# Patient Record
Sex: Female | Born: 1975 | Race: White | Hispanic: No | Marital: Married | Smoking: Former smoker
Health system: Southern US, Community
[De-identification: ages and names within clinical notes are randomized; demographics above are authoritative.]

## PROBLEM LIST (undated history)

## (undated) DIAGNOSIS — J45909 Unspecified asthma, uncomplicated: Secondary | ICD-10-CM

## (undated) DIAGNOSIS — Z87442 Personal history of urinary calculi: Secondary | ICD-10-CM

## (undated) DIAGNOSIS — N183 Chronic kidney disease, stage 3 unspecified: Secondary | ICD-10-CM

## (undated) DIAGNOSIS — Z9221 Personal history of antineoplastic chemotherapy: Secondary | ICD-10-CM

## (undated) DIAGNOSIS — K219 Gastro-esophageal reflux disease without esophagitis: Secondary | ICD-10-CM

## (undated) DIAGNOSIS — Z853 Personal history of malignant neoplasm of breast: Secondary | ICD-10-CM

## (undated) DIAGNOSIS — D649 Anemia, unspecified: Secondary | ICD-10-CM

## (undated) DIAGNOSIS — Z8669 Personal history of other diseases of the nervous system and sense organs: Secondary | ICD-10-CM

## (undated) DIAGNOSIS — M719 Bursopathy, unspecified: Secondary | ICD-10-CM

## (undated) DIAGNOSIS — E0500 Thyrotoxicosis with diffuse goiter without thyrotoxic crisis or storm: Secondary | ICD-10-CM

## (undated) DIAGNOSIS — J302 Other seasonal allergic rhinitis: Secondary | ICD-10-CM

## (undated) HISTORY — PX: ABDOMINAL HYSTERECTOMY: SHX81

## (undated) HISTORY — PX: CYSTOSCOPY W/ URETERAL STENT PLACEMENT: SHX1429

## (undated) HISTORY — PX: WISDOM TOOTH EXTRACTION: SHX21

---

## 2013-10-28 LAB — OB RESULTS CONSOLE HEPATITIS B SURFACE ANTIGEN: HEP B S AG: NEGATIVE

## 2013-10-28 LAB — OB RESULTS CONSOLE HIV ANTIBODY (ROUTINE TESTING): HIV: NONREACTIVE

## 2013-10-28 LAB — OB RESULTS CONSOLE ABO/RH: RH Type: NEGATIVE

## 2013-10-28 LAB — OB RESULTS CONSOLE RUBELLA ANTIBODY, IGM: RUBELLA: IMMUNE

## 2013-10-28 LAB — OB RESULTS CONSOLE ANTIBODY SCREEN: Antibody Screen: NEGATIVE

## 2013-10-28 LAB — OB RESULTS CONSOLE RPR: RPR: NONREACTIVE

## 2013-10-28 LAB — OB RESULTS CONSOLE GC/CHLAMYDIA
Chlamydia: NEGATIVE
GC PROBE AMP, GENITAL: NEGATIVE

## 2013-12-27 ENCOUNTER — Inpatient Hospital Stay (HOSPITAL_COMMUNITY): Payer: BC Managed Care – PPO

## 2013-12-27 ENCOUNTER — Encounter (HOSPITAL_COMMUNITY): Payer: Self-pay | Admitting: Family

## 2013-12-27 ENCOUNTER — Observation Stay (HOSPITAL_COMMUNITY)
Admission: AD | Admit: 2013-12-27 | Discharge: 2013-12-28 | Disposition: A | Discharge status: Home / Self Care | Payer: BC Managed Care – PPO | Source: Ambulatory Visit | Attending: Obstetrics and Gynecology | Admitting: Obstetrics and Gynecology

## 2013-12-27 DIAGNOSIS — E282 Polycystic ovarian syndrome: Secondary | ICD-10-CM | POA: Insufficient documentation

## 2013-12-27 DIAGNOSIS — O4410 Placenta previa with hemorrhage, unspecified trimester: Secondary | ICD-10-CM | POA: Diagnosis not present

## 2013-12-27 DIAGNOSIS — O4453 Low lying placenta with hemorrhage, third trimester: Secondary | ICD-10-CM | POA: Diagnosis present

## 2013-12-27 DIAGNOSIS — O3400 Maternal care for unspecified congenital malformation of uterus, unspecified trimester: Secondary | ICD-10-CM | POA: Diagnosis not present

## 2013-12-27 DIAGNOSIS — Q513 Bicornate uterus: Secondary | ICD-10-CM | POA: Diagnosis not present

## 2013-12-27 DIAGNOSIS — O09529 Supervision of elderly multigravida, unspecified trimester: Secondary | ICD-10-CM | POA: Insufficient documentation

## 2013-12-27 DIAGNOSIS — E039 Hypothyroidism, unspecified: Secondary | ICD-10-CM | POA: Diagnosis not present

## 2013-12-27 DIAGNOSIS — O99280 Endocrine, nutritional and metabolic diseases complicating pregnancy, unspecified trimester: Secondary | ICD-10-CM

## 2013-12-27 DIAGNOSIS — O4690 Antepartum hemorrhage, unspecified, unspecified trimester: Secondary | ICD-10-CM | POA: Diagnosis present

## 2013-12-27 DIAGNOSIS — E079 Disorder of thyroid, unspecified: Secondary | ICD-10-CM | POA: Diagnosis not present

## 2013-12-27 DIAGNOSIS — O368190 Decreased fetal movements, unspecified trimester, not applicable or unspecified: Secondary | ICD-10-CM | POA: Diagnosis not present

## 2013-12-27 HISTORY — DX: Bursopathy, unspecified: M71.9

## 2013-12-27 HISTORY — DX: Other seasonal allergic rhinitis: J30.2

## 2013-12-27 LAB — CBC WITH DIFFERENTIAL/PLATELET
Basophils Absolute: 0 10*3/uL (ref 0.0–0.1)
Basophils Relative: 0 % (ref 0–1)
EOS ABS: 0.1 10*3/uL (ref 0.0–0.7)
EOS PCT: 1 % (ref 0–5)
HEMATOCRIT: 28.4 % — AB (ref 36.0–46.0)
Hemoglobin: 9.4 g/dL — ABNORMAL LOW (ref 12.0–15.0)
LYMPHS ABS: 2.4 10*3/uL (ref 0.7–4.0)
Lymphocytes Relative: 20 % (ref 12–46)
MCH: 30.4 pg (ref 26.0–34.0)
MCHC: 33.1 g/dL (ref 30.0–36.0)
MCV: 91.9 fL (ref 78.0–100.0)
MONO ABS: 0.7 10*3/uL (ref 0.1–1.0)
Monocytes Relative: 6 % (ref 3–12)
Neutro Abs: 8.5 10*3/uL — ABNORMAL HIGH (ref 1.7–7.7)
Neutrophils Relative %: 73 % (ref 43–77)
PLATELETS: 215 10*3/uL (ref 150–400)
RBC: 3.09 MIL/uL — AB (ref 3.87–5.11)
RDW: 13.6 % (ref 11.5–15.5)
WBC: 11.7 10*3/uL — AB (ref 4.0–10.5)

## 2013-12-27 LAB — ABO/RH: ABO/RH(D): O NEG

## 2013-12-27 MED ORDER — ACETAMINOPHEN 325 MG PO TABS
650.0000 mg | ORAL_TABLET | ORAL | Status: DC | PRN
  Administered 2013-12-27: 650 mg via ORAL
  Filled 2013-12-27: qty 2

## 2013-12-27 MED ORDER — CALCIUM CARBONATE ANTACID 500 MG PO CHEW
2.0000 | CHEWABLE_TABLET | ORAL | Status: DC | PRN
  Filled 2013-12-27: qty 2

## 2013-12-27 MED ORDER — PRENATAL MULTIVITAMIN CH
1.0000 | ORAL_TABLET | Freq: Every day | ORAL | Status: DC
  Administered 2013-12-27 – 2013-12-28 (×2): 1 via ORAL
  Filled 2013-12-27 (×2): qty 1

## 2013-12-27 MED ORDER — FERROUS SULFATE 325 (65 FE) MG PO TABS
325.0000 mg | ORAL_TABLET | Freq: Every day | ORAL | Status: DC
  Administered 2013-12-28: 325 mg via ORAL
  Filled 2013-12-27: qty 1

## 2013-12-27 MED ORDER — FAMOTIDINE 20 MG PO TABS
20.0000 mg | ORAL_TABLET | Freq: Two times a day (BID) | ORAL | Status: DC
  Administered 2013-12-27 – 2013-12-28 (×3): 20 mg via ORAL
  Filled 2013-12-27 (×3): qty 1

## 2013-12-27 MED ORDER — ZOLPIDEM TARTRATE 5 MG PO TABS: 5.0000 mg | ORAL_TABLET | Freq: Every evening | ORAL | Status: DC | PRN

## 2013-12-27 MED ORDER — DOCUSATE SODIUM 100 MG PO CAPS
100.0000 mg | ORAL_CAPSULE | Freq: Every day | ORAL | Status: DC
  Administered 2013-12-27 – 2013-12-28 (×2): 100 mg via ORAL
  Filled 2013-12-27 (×3): qty 1

## 2013-12-27 MED ORDER — LEVOTHYROXINE SODIUM 25 MCG PO TABS
25.0000 ug | ORAL_TABLET | Freq: Every day | ORAL | Status: DC
  Administered 2013-12-27: 25 ug via ORAL
  Filled 2013-12-27 (×2): qty 1

## 2013-12-27 MED ORDER — LORATADINE 10 MG PO TABS
10.0000 mg | ORAL_TABLET | Freq: Every day | ORAL | Status: DC
  Administered 2013-12-27 – 2013-12-28 (×2): 10 mg via ORAL
  Filled 2013-12-27 (×3): qty 1

## 2013-12-27 NOTE — Progress Notes (Signed)
Pad # 2. 0.2cm x 5cm dark blood streak on pad noted.

## 2013-12-27 NOTE — H&P (Addendum)
Betty Ball is a 38 y.o. female [phone] at 24+5 with VB, h/o previa, now lowlying placenta - again eval by [place] 1.7cm from internal os.  +FM, no LOF, no VB, occ ctx; Pregnancy complicated by AMA, hypothyroidism, h/o GDM, kidney malformation; Also bicornuate uterus and PCOS.  Dated by 6wk [place] cw LMP - [facility] 04/13/14 Maternal Medical History:  Reason for admission: Vaginal bleeding.   Contractions: Frequency: irregular.    Fetal activity: Perceived fetal activity is normal.    Prenatal Complications - Diabetes: none.    OB History   Grav Para Term Preterm Abortions TAB SAB Ect Mult Living   5 2  2 2  2        SAB x 2 35wk LTCS 4#13 female 26wk LTCS 6# female, GDM No abn pap No STDs  Bicornuate uterus, PCOS  Past Medical History  Diagnosis Date  . Gestational diabetes     previous pregnancy  rosacea, hypothyroidism, migraines, hiatal hernia, bursitis, depression, seasonal allergies, kidney problems  Past Surgical History  Procedure Laterality Date  . 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   Prenatal Transfer Tool  Maternal Diabetes: No h/o GDM Genetic Screening: Normal Maternal Ultrasounds/Referrals: Abnormal:  Findings:   Other:previa - LLP Fetal Ultrasounds or other Referrals:  None Maternal Substance Abuse:  No Significant Maternal Medications:  Meds include: Syntroid Other: 17 OHP (Tuesday) Significant Maternal Lab Results:  None Other Comments:  bicornuate uterus, h/o 35 and 36 wk delivery  Review of Systems  Constitutional: Negative.   HENT: Negative.   Eyes: Negative.   Respiratory: Negative.   Cardiovascular: Negative.   Gastrointestinal: Negative.   Genitourinary: Negative.   Musculoskeletal: Negative.   Skin: Negative.   Neurological:  Negative.   Psychiatric/Behavioral: Negative.       Blood pressure 121/60, pulse 94, temperature 98 F (36.7 C), temperature source Oral, resp. rate 20. Maternal Exam:  Uterine Assessment: Contraction strength is mild.  Contraction frequency is irregular.   Abdomen: Surgical scars: low transverse.   Fundal height is appropriate for gestation.    Introitus: Normal vulva. Normal vagina.    Physical Exam  Constitutional: She is oriented to person, place, and time. She appears well-developed and well-nourished.  HENT:  Head: Normocephalic and atraumatic.  Cardiovascular: Normal rate and regular rhythm.   Respiratory: Effort normal and breath sounds normal. No respiratory distress. She has no wheezes.  GI: Soft. Bowel sounds are normal. She exhibits no distension. There is no tenderness.  Musculoskeletal: Normal range of motion.  Neurological: She is alert and oriented to person, place, and time.  Skin: Skin is warm and dry.  Psychiatric: She has a normal mood and affect. Her behavior is normal.    Prenatal labs: ABO, Rh:  O neg Antibody:  neg Rubella:  immune RPR:   NR HBsAg:   neg HIV:   neg GBS:   unknown  Hgb 12.2/Plt 286K/ GC neg/ Chl neg/ CF neg/ TSH elevated, then low - adjusting syntroid/AFP neg, informa WNL female/HgbA1C 5.6  6wk cwd, EDC 01/05/14 Anatomy nl limited previa Repeat, nl anat, female LLP  [place] in hospital nl AFI, no abruption noted, posterior LLP female  Hgb 9.4   Assessment/Plan: 37yo [id number] at 24+5 with 3rd trimester VB -  no abruption on [place], LLP Cont EFM/toco for 24 hr T/C BMZ D/w pt likely abruption and decreasing work hours Continue pad count/close monitoring  Hgb 9.4 - add daily iron  Betty Ball 12/27/2013, 2:46 PM

## 2013-12-27 NOTE — MAU Note (Signed)
38 yo, G5P3 at [redacted]w[redacted]d, presents to MAU with c/o VB since 0700 today; reports decreased FM. Pregnancy complicated by previa. She is taking 17P weekly.

## 2013-12-27 NOTE — Progress Notes (Signed)
Pad 1- 4cm sized area dark blood noted on pad with streak of dark blood on top. Pt given instructions on pad count and how to gt up to bathroom.

## 2013-12-28 DIAGNOSIS — O4690 Antepartum hemorrhage, unspecified, unspecified trimester: Secondary | ICD-10-CM | POA: Diagnosis present

## 2013-12-28 DIAGNOSIS — O4410 Placenta previa with hemorrhage, unspecified trimester: Secondary | ICD-10-CM | POA: Diagnosis not present

## 2013-12-28 LAB — PREPARE RBC (CROSSMATCH)

## 2013-12-28 MED ORDER — FERROUS SULFATE 325 (65 FE) MG PO TABS: 325.0000 mg | ORAL_TABLET | Freq: Every day | ORAL | Status: DC

## 2013-12-28 NOTE — Progress Notes (Signed)
Pt. Discharged home with significant other at side. Discharge teaching completed with pt. Verbalizing an understanding to receive P17 shot on Tuesday in office, follow up with physician next week, return for bleeding and s/s of preterm labor, no sex, mild to moderate activity, and take medication as prescribed.

## 2013-12-28 NOTE — Discharge Summary (Signed)
Obstetric Discharge Summary Reason for Admission: VB Prenatal Procedures: NST and ultrasound  Hemoglobin  Date Value Ref Range Status  12/27/2013 9.4* 12.0 - 15.0 g/dL Final     HCT  Date Value Ref Range Status  12/27/2013 28.4* 36.0 - 46.0 % Final    Physical Exam:  General: alert and no distress Uterus FNT +FM, no LOF, brownish d/c, occ cramping  Discharge Diagnoses: VB 3rd trimester  Discharge Information: Date: 12/28/2013 Activity: pelvic rest Diet: routine Medications: PNV Condition: stable Instructions: Call [phone] with questions or problems Discharge to: home Follow-up Information   Follow up with MEISINGER,TODD D, MD. (Come to office for 17P injection Tuesday, get worked in next week for office visit)    Specialty:  Obstetrics and Gynecology   Contact information:   [address] [place] [address] [place] 12/28/2013, 10:42 AM

## 2013-12-28 NOTE — Progress Notes (Signed)
Patient ID: Betty Ball, female   DOB: [birth year], 39 y.o.   MRN: [id number]  No c/o's, some brownish d/c.  occ ctx.  No LOF, occ cramping  AFVSS gen NAD FHTs 150's, appropriate for gestation toco occ  37yo with 3rd trimester VB Likely partial abruption, as bleeding had slowed considerably and stayed small at hospital steroids not given, strict precautions given for return,  Will consider BMZ

## 2013-12-30 LAB — TYPE AND SCREEN
ABO/RH(D): O NEG
Antibody Screen: NEGATIVE
Unit division: 0
Unit division: 0

## 2014-01-21 ENCOUNTER — Encounter: Payer: BC Managed Care – PPO | Attending: Obstetrics and Gynecology

## 2014-01-21 VITALS — Ht 62.5 in | Wt 223.1 lb

## 2014-01-21 DIAGNOSIS — O99810 Abnormal glucose complicating pregnancy: Secondary | ICD-10-CM

## 2014-01-21 DIAGNOSIS — Z713 Dietary counseling and surveillance: Secondary | ICD-10-CM | POA: Insufficient documentation

## 2014-01-28 NOTE — Progress Notes (Signed)
  Patient was seen on 01/21/2014 for Gestational Diabetes self-management class at the [facility]. The following learning objectives were met by the patient during this course:   States the definition of Gestational Diabetes  States why dietary management is important in controlling blood glucose  Describes the effects each nutrient has on blood glucose levels  Demonstrates ability to create a balanced meal plan  Demonstrates carbohydrate counting   States when to check blood glucose levels  Demonstrates proper blood glucose monitoring techniques  States the effect of stress and exercise on blood glucose levels  States the importance of limiting caffeine and abstaining from alcohol and smoking  Blood glucose monitor given: Accu Chek Nano BG Monitoring Kit Lot # [id number] Exp: 11/05/2014 Blood glucose reading: 90 mg/dl  Patient instructed to monitor glucose levels: FBS: 60 - <90 1 hour: <140 2 hour: <120  *Patient received handouts:  Nutrition Diabetes and Pregnancy  Carbohydrate Counting List  Patient will be seen for follow-up as needed.

## 2014-03-09 LAB — OB RESULTS CONSOLE GBS: GBS: NEGATIVE

## 2014-03-21 ENCOUNTER — Encounter (HOSPITAL_COMMUNITY): Payer: Self-pay

## 2014-03-21 ENCOUNTER — Inpatient Hospital Stay (HOSPITAL_COMMUNITY)
Admission: AD | Admit: 2014-03-21 | Discharge: 2014-03-21 | Disposition: A | Discharge status: Home / Self Care | Payer: Medicaid Other | Source: Ambulatory Visit | Attending: Obstetrics and Gynecology | Admitting: Obstetrics and Gynecology

## 2014-03-21 DIAGNOSIS — O479 False labor, unspecified: Secondary | ICD-10-CM | POA: Insufficient documentation

## 2014-03-21 MED ORDER — PANTOPRAZOLE SODIUM 40 MG PO TBEC
40.0000 mg | DELAYED_RELEASE_TABLET | Freq: Once | ORAL | Status: AC
  Administered 2014-03-21: 40 mg via ORAL
  Filled 2014-03-21: qty 1

## 2014-03-21 MED ORDER — PANTOPRAZOLE SODIUM 40 MG PO TBEC
40.0000 mg | DELAYED_RELEASE_TABLET | Freq: Every day | ORAL | Status: DC
  Filled 2014-03-21: qty 1

## 2014-03-21 NOTE — Discharge Instructions (Signed)
Braxton Hicks Contractions Contractions of the uterus can occur throughout pregnancy. Contractions are not always a sign that you are in labor.  WHAT ARE BRAXTON HICKS CONTRACTIONS?  Contractions that occur before labor are called Braxton Hicks contractions, or false labor. Toward the end of pregnancy (32-34 weeks), these contractions can develop more often and may become more forceful. This is not true labor because these contractions do not result in opening (dilatation) and thinning of the cervix. They are sometimes difficult to tell apart from true labor because these contractions can be forceful and people have different pain tolerances. You should not feel embarrassed if you go to the hospital with false labor. Sometimes, the only way to tell if you are in true labor is for your health care provider to look for changes in the cervix. If there are no prenatal problems or other health problems associated with the pregnancy, it is completely safe to be sent home with false labor and await the onset of true labor. HOW CAN YOU TELL THE DIFFERENCE BETWEEN TRUE AND FALSE LABOR? False Labor  The contractions of false labor are usually shorter and not as hard as those of true labor.   The contractions are usually irregular.   The contractions are often felt in the front of the lower abdomen and in the groin.   The contractions may go away when you walk around or change positions while lying down.   The contractions get weaker and are shorter lasting as time goes on.   The contractions do not usually become progressively stronger, regular, and closer together as with true labor.  True Labor  Contractions in true labor last 30-70 seconds, become very regular, usually become more intense, and increase in frequency.   The contractions do not go away with walking.   The discomfort is usually felt in the top of the uterus and spreads to the lower abdomen and low back.   True labor can be  determined by your health care provider with an exam. This will show that the cervix is dilating and getting thinner.  WHAT TO REMEMBER  Keep up with your usual exercises and follow other instructions given by your health care provider.   Take medicines as directed by your health care provider.   Keep your regular prenatal appointments.   Eat and drink lightly if you think you are going into labor.   If Braxton Hicks contractions are making you uncomfortable:   Change your position from lying down or resting to walking, or from walking to resting.   Sit and rest in a tub of warm water.   Drink 2-3 glasses of water. Dehydration may cause these contractions.   Do slow and deep breathing several times an hour.  WHEN SHOULD I SEEK IMMEDIATE MEDICAL CARE? Seek immediate medical care if:  Your contractions become stronger, more regular, and closer together.   You have fluid leaking or gushing from your vagina.   You have a fever.   You pass blood-tinged mucus.   You have vaginal bleeding.   You have continuous abdominal pain.   You have low back pain that you never had before.   You feel your baby's head pushing down and causing pelvic pressure.   Your baby is not moving as much as it used to.  Document Released: 07/24/2005 Document Revised: 07/29/2013 Document Reviewed: 05/05/2013 ExitCare Patient Information 2015 [employer]. This information is not intended to replace advice given to you by your health care   provider. Make sure you discuss any questions you have with your health care provider.  Fetal Movement Counts Patient Name: __________________________________________________ Patient Due Date: ____________________ Performing a fetal movement count is highly recommended in high-risk pregnancies, but it is good for every pregnant woman to do. Your health care provider may ask you to start counting fetal movements at 28 weeks of the pregnancy. Fetal  movements often increase:  After eating a full meal.  After physical activity.  After eating or drinking something sweet or cold.  At rest. Pay attention to when you feel the baby is most active. This will help you notice a pattern of your baby's sleep and wake cycles and what factors contribute to an increase in fetal movement. It is important to perform a fetal movement count at the same time each day when your baby is normally most active.  HOW TO COUNT FETAL MOVEMENTS 1. Find a quiet and comfortable area to sit or lie down on your left side. Lying on your left side provides the best blood and oxygen circulation to your baby. 2. Write down the day and time on a sheet of paper or in a journal. 3. Start counting kicks, flutters, swishes, rolls, or jabs in a 2-hour period. You should feel at least 10 movements within 2 hours. 4. If you do not feel 10 movements in 2 hours, wait 2-3 hours and count again. Look for a change in the pattern or not enough counts in 2 hours. SEEK MEDICAL CARE IF:  You feel less than 10 counts in 2 hours, tried twice.  There is no movement in over an hour.  The pattern is changing or taking longer each day to reach 10 counts in 2 hours.  You feel the baby is not moving as he or she usually does. Date: ____________ Movements: ____________ Start time: ____________ Finish time: ____________  Date: ____________ Movements: ____________ Start time: ____________ Finish time: ____________ Date: ____________ Movements: ____________ Start time: ____________ Finish time: ____________ Date: ____________ Movements: ____________ Start time: ____________ Finish time: ____________ Date: ____________ Movements: ____________ Start time: ____________ Finish time: ____________ Date: ____________ Movements: ____________ Start time: ____________ Finish time: ____________ Date: ____________ Movements: ____________ Start time: ____________ Finish time: ____________ Date: ____________  Movements: ____________ Start time: ____________ Finish time: ____________  Date: ____________ Movements: ____________ Start time: ____________ Finish time: ____________ Date: ____________ Movements: ____________ Start time: ____________ Finish time: ____________ Date: ____________ Movements: ____________ Start time: ____________ Finish time: ____________ Date: ____________ Movements: ____________ Start time: ____________ Finish time: ____________ Date: ____________ Movements: ____________ Start time: ____________ Finish time: ____________ Date: ____________ Movements: ____________ Start time: ____________ Finish time: ____________ Date: ____________ Movements: ____________ Start time: ____________ Finish time: ____________  Date: ____________ Movements: ____________ Start time: ____________ Finish time: ____________ Date: ____________ Movements: ____________ Start time: ____________ Finish time: ____________ Date: ____________ Movements: ____________ Start time: ____________ Finish time: ____________ Date: ____________ Movements: ____________ Start time: ____________ Finish time: ____________ Date: ____________ Movements: ____________ Start time: ____________ Finish time: ____________ Date: ____________ Movements: ____________ Start time: ____________ Finish time: ____________ Date: ____________ Movements: ____________ Start time: ____________ Finish time: ____________  Date: ____________ Movements: ____________ Start time: ____________ Finish time: ____________ Date: ____________ Movements: ____________ Start time: ____________ Finish time: ____________ Date: ____________ Movements: ____________ Start time: ____________ Finish time: ____________ Date: ____________ Movements: ____________ Start time: ____________ Finish time: ____________ Date: ____________ Movements: ____________ Start time: ____________ Finish time: ____________ Date: ____________ Movements: ____________ Start time:  ____________ Finish time: ____________ Date: ____________ Movements:   ____________ Start time: ____________ Finish time: ____________  Date: ____________ Movements: ____________ Start time: ____________ Finish time: ____________ Date: ____________ Movements: ____________ Start time: ____________ Finish time: ____________ Date: ____________ Movements: ____________ Start time: ____________ Finish time: ____________ Date: ____________ Movements: ____________ Start time: ____________ Finish time: ____________ Date: ____________ Movements: ____________ Start time: ____________ Finish time: ____________ Date: ____________ Movements: ____________ Start time: ____________ Finish time: ____________ Date: ____________ Movements: ____________ Start time: ____________ Finish time: ____________  Date: ____________ Movements: ____________ Start time: ____________ Finish time: ____________ Date: ____________ Movements: ____________ Start time: ____________ Finish time: ____________ Date: ____________ Movements: ____________ Start time: ____________ Finish time: ____________ Date: ____________ Movements: ____________ Start time: ____________ Finish time: ____________ Date: ____________ Movements: ____________ Start time: ____________ Finish time: ____________ Date: ____________ Movements: ____________ Start time: ____________ Finish time: ____________ Date: ____________ Movements: ____________ Start time: ____________ Finish time: ____________  Date: ____________ Movements: ____________ Start time: ____________ Finish time: ____________ Date: ____________ Movements: ____________ Start time: ____________ Finish time: ____________ Date: ____________ Movements: ____________ Start time: ____________ Finish time: ____________ Date: ____________ Movements: ____________ Start time: ____________ Finish time: ____________ Date: ____________ Movements: ____________ Start time: ____________ Finish time: ____________ Date:  ____________ Movements: ____________ Start time: ____________ Finish time: ____________ Date: ____________ Movements: ____________ Start time: ____________ Finish time: ____________  Date: ____________ Movements: ____________ Start time: ____________ Finish time: ____________ Date: ____________ Movements: ____________ Start time: ____________ Finish time: ____________ Date: ____________ Movements: ____________ Start time: ____________ Finish time: ____________ Date: ____________ Movements: ____________ Start time: ____________ Finish time: ____________ Date: ____________ Movements: ____________ Start time: ____________ Finish time: ____________ Date: ____________ Movements: ____________ Start time: ____________ Finish time: ____________ Document Released: 08/23/2006 Document Revised: 12/08/2013 Document Reviewed: 05/20/2012 ExitCare Patient Information 2015 [employer]. This information is not intended to replace advice given to you by your health care provider. Make sure you discuss any questions you have with your health care provider.  

## 2014-03-21 NOTE — MAU Note (Signed)
Dr Marvel Plan notified of SVE, contraction pattern and small amount of vaginal bleeding after SVE. Watch pt and recheck in 1-2 hours.

## 2014-03-21 NOTE — MAU Note (Signed)
Contractions every 5 mins for last 3 hours. Denies leaking fld or bleeding. Hx GDM and low lying placenta

## 2014-03-21 NOTE — Progress Notes (Signed)
Written and verbal d/c instructions given and understanding voiced. 

## 2014-03-26 ENCOUNTER — Encounter (HOSPITAL_COMMUNITY): Payer: Self-pay | Admitting: *Deleted

## 2014-03-26 ENCOUNTER — Other Ambulatory Visit (HOSPITAL_COMMUNITY): Payer: Self-pay | Admitting: Obstetrics and Gynecology

## 2014-03-26 ENCOUNTER — Inpatient Hospital Stay (HOSPITAL_COMMUNITY)
Admission: AD | Admit: 2014-03-26 | Discharge: 2014-03-28 | DRG: 780 | Disposition: A | Discharge status: Home / Self Care | Payer: Medicaid Other | Source: Ambulatory Visit | Attending: Obstetrics and Gynecology | Admitting: Obstetrics and Gynecology

## 2014-03-26 ENCOUNTER — Ambulatory Visit (HOSPITAL_COMMUNITY)
Admission: RE | Admit: 2014-03-26 | Discharge: 2014-03-26 | Disposition: A | Discharge status: Home / Self Care | Payer: Medicaid Other | Source: Ambulatory Visit | Attending: Obstetrics and Gynecology | Admitting: Obstetrics and Gynecology

## 2014-03-26 DIAGNOSIS — O99280 Endocrine, nutritional and metabolic diseases complicating pregnancy, unspecified trimester: Secondary | ICD-10-CM

## 2014-03-26 DIAGNOSIS — Z8632 Personal history of gestational diabetes: Secondary | ICD-10-CM

## 2014-03-26 DIAGNOSIS — O09529 Supervision of elderly multigravida, unspecified trimester: Secondary | ICD-10-CM | POA: Diagnosis present

## 2014-03-26 DIAGNOSIS — Q513 Bicornate uterus: Secondary | ICD-10-CM

## 2014-03-26 DIAGNOSIS — E079 Disorder of thyroid, unspecified: Secondary | ICD-10-CM | POA: Diagnosis present

## 2014-03-26 DIAGNOSIS — E039 Hypothyroidism, unspecified: Secondary | ICD-10-CM | POA: Diagnosis present

## 2014-03-26 DIAGNOSIS — O4453 Low lying placenta with hemorrhage, third trimester: Secondary | ICD-10-CM

## 2014-03-26 DIAGNOSIS — O479 False labor, unspecified: Principal | ICD-10-CM | POA: Diagnosis present

## 2014-03-26 DIAGNOSIS — O4693 Antepartum hemorrhage, unspecified, third trimester: Secondary | ICD-10-CM

## 2014-03-26 DIAGNOSIS — O34219 Maternal care for unspecified type scar from previous cesarean delivery: Secondary | ICD-10-CM | POA: Diagnosis present

## 2014-03-26 DIAGNOSIS — O288 Other abnormal findings on antenatal screening of mother: Secondary | ICD-10-CM

## 2014-03-26 DIAGNOSIS — O3400 Maternal care for unspecified congenital malformation of uterus, unspecified trimester: Secondary | ICD-10-CM | POA: Diagnosis present

## 2014-03-26 MED ORDER — NALBUPHINE HCL 10 MG/ML IJ SOLN: 5.0000 mg | Freq: Once | INTRAMUSCULAR | Status: DC

## 2014-03-26 MED ORDER — LACTATED RINGERS IV SOLN
INTRAVENOUS | Status: DC
  Administered 2014-03-26: via INTRAVENOUS

## 2014-03-26 NOTE — MAU Provider Note (Signed)
In MAU for labor check. FHTs 130's R, category 1

## 2014-03-26 NOTE — MAU Note (Signed)
Dr. Melba Coon notified of patient arrival. Hx of low lying placenta and bleeding after cervical exam. After careful review of records by Dr. Melba Coon, orders to perform cervical exam recieved. Dr. Melba Coon also notified of elevated BPs.

## 2014-03-26 NOTE — MAU Note (Signed)
Patient refuses pain medication at this time. LR infusing

## 2014-03-26 NOTE — MAU Note (Signed)
Ctx this evening. Denies LOF or vaginal bleeding. Was 1 cm in office. Scheduled for repeat c/section. Positive fetal movement.

## 2014-03-27 ENCOUNTER — Encounter (HOSPITAL_COMMUNITY): Payer: Self-pay | Admitting: *Deleted

## 2014-03-27 DIAGNOSIS — O3400 Maternal care for unspecified congenital malformation of uterus, unspecified trimester: Secondary | ICD-10-CM | POA: Diagnosis present

## 2014-03-27 DIAGNOSIS — O09529 Supervision of elderly multigravida, unspecified trimester: Secondary | ICD-10-CM | POA: Diagnosis present

## 2014-03-27 DIAGNOSIS — E039 Hypothyroidism, unspecified: Secondary | ICD-10-CM | POA: Diagnosis present

## 2014-03-27 DIAGNOSIS — Z8632 Personal history of gestational diabetes: Secondary | ICD-10-CM | POA: Diagnosis not present

## 2014-03-27 DIAGNOSIS — E079 Disorder of thyroid, unspecified: Secondary | ICD-10-CM | POA: Diagnosis present

## 2014-03-27 DIAGNOSIS — O34219 Maternal care for unspecified type scar from previous cesarean delivery: Secondary | ICD-10-CM | POA: Diagnosis present

## 2014-03-27 DIAGNOSIS — Q513 Bicornate uterus: Secondary | ICD-10-CM | POA: Diagnosis not present

## 2014-03-27 DIAGNOSIS — O479 False labor, unspecified: Secondary | ICD-10-CM | POA: Diagnosis present

## 2014-03-27 LAB — CBC
HCT: 27.9 % — ABNORMAL LOW (ref 36.0–46.0)
Hemoglobin: 9.3 g/dL — ABNORMAL LOW (ref 12.0–15.0)
MCH: 29.9 pg (ref 26.0–34.0)
MCHC: 33.3 g/dL (ref 30.0–36.0)
MCV: 89.7 fL (ref 78.0–100.0)
Platelets: 184 10*3/uL (ref 150–400)
RBC: 3.11 MIL/uL — ABNORMAL LOW (ref 3.87–5.11)
RDW: 14 % (ref 11.5–15.5)
WBC: 11.1 10*3/uL — ABNORMAL HIGH (ref 4.0–10.5)

## 2014-03-27 LAB — GLUCOSE, CAPILLARY
GLUCOSE-CAPILLARY: 79 mg/dL (ref 70–99)
Glucose-Capillary: 100 mg/dL — ABNORMAL HIGH (ref 70–99)
Glucose-Capillary: 85 mg/dL (ref 70–99)
Glucose-Capillary: 112 mg/dL — ABNORMAL HIGH (ref 70–99)
Glucose-Capillary: 156 mg/dL — ABNORMAL HIGH (ref 70–99)

## 2014-03-27 LAB — TYPE AND SCREEN
ABO/RH(D): O NEG
Antibody Screen: NEGATIVE

## 2014-03-27 LAB — RPR

## 2014-03-27 MED ORDER — GLYBURIDE 2.5 MG PO TABS
2.5000 mg | ORAL_TABLET | Freq: Every day | ORAL | Status: DC
  Administered 2014-03-27: 2.5 mg via ORAL
  Filled 2014-03-27: qty 1

## 2014-03-27 MED ORDER — FAMOTIDINE 20 MG PO TABS
20.0000 mg | ORAL_TABLET | Freq: Two times a day (BID) | ORAL | Status: DC
  Administered 2014-03-27: 20 mg via ORAL
  Filled 2014-03-27: qty 1

## 2014-03-27 MED ORDER — GLYBURIDE 5 MG PO TABS
5.0000 mg | ORAL_TABLET | Freq: Every day | ORAL | Status: DC
  Administered 2014-03-28: 5 mg via ORAL
  Filled 2014-03-27: qty 1

## 2014-03-27 MED ORDER — LEVOTHYROXINE SODIUM 25 MCG PO TABS
25.0000 ug | ORAL_TABLET | Freq: Every day | ORAL | Status: DC
  Administered 2014-03-27: 25 ug via ORAL
  Filled 2014-03-27: qty 1

## 2014-03-27 MED ORDER — LORATADINE 10 MG PO TABS
10.0000 mg | ORAL_TABLET | Freq: Every day | ORAL | Status: DC
  Administered 2014-03-27: 10 mg via ORAL
  Filled 2014-03-27: qty 1

## 2014-03-27 NOTE — H&P (Signed)
Betty Ball is a 38 y.o. female [phone] at 37 wk presents with contractions no cervical change, however continued ctx.  Pt has never been this far along in pregnancy before and is uncomfortable.    Maternal Medical History:  Reason for admission: Contractions.   Contractions: Frequency: regular.   Perceived severity is strong.    Fetal activity: Perceived fetal activity is normal.      OB History   Grav Para Term Preterm Abortions TAB SAB Ect Mult Living   5 2  2 2  2         Past Medical History  Diagnosis Date  . Bicornate uterus   . 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 Hypothyroidism   . Rosacea   . Migraines   . Seasonal allergies   . Bursitis   . Depression   . Hiatal hernia   . TMJ (dislocation of temporomandibular joint)   . Endometriosis   . Polycystic ovarian disease   . Gestational diabetes    Past Surgical History  Procedure Laterality Date  . Cesarean section  2001, 2009  . Kidney surgery    . Laprascopic    . Wisdom tooth extraction     Family History: family history is not on file. Social History:  reports that she has never smoked. She has never used smokeless tobacco. She reports that she does not drink alcohol or use illicit drugs.   Prenatal Transfer Tool  Maternal Diabetes: No Genetic Screening: Normal Maternal Ultrasounds/Referrals: Abnormal:  Findings:   Other:low-lying placenta Fetal Ultrasounds or other Referrals:  None Maternal Substance Abuse:  No Significant Maternal Medications:  Meds include: Syntroid Significant Maternal Lab Results:  Lab values include: Group B Strep negative Other Comments:  H/O 2 36 wk LTCS, on 17-OHP  Review of Systems  Constitutional: Negative.   HENT: Negative.   Eyes: Negative.   Respiratory: Negative.   Cardiovascular: Negative.   Gastrointestinal: Negative.   Genitourinary: Negative.   Musculoskeletal: Positive for back pain.  Skin: Negative.   Neurological: Negative.    Psychiatric/Behavioral: Negative.     Dilation: 1 Effacement (%): Thick Station: -3 Exam by:: L. Munford RN Blood pressure 121/75, pulse 91, temperature 97.6 F (36.4 C), temperature source Oral, resp. rate 18, height 5\' 2"  (1.575 m), weight 101.606 kg (224 lb). Maternal Exam:  Uterine Assessment: Contraction strength is moderate.  Contraction frequency is regular.   Abdomen: Surgical scars: low transverse.   Fundal height is appropriate for gestation.   Fetal presentation: vertex  Introitus: Normal vulva. Normal vagina.  Cervix: Cervix evaluated by digital exam.     Physical Exam  Constitutional: She is oriented to person, place, and time. She appears well-developed and well-nourished.  HENT:  Head: Normocephalic and atraumatic.  Cardiovascular: Normal rate and regular rhythm.   Respiratory: Effort normal and breath sounds normal. No respiratory distress. She has no wheezes.  GI: Soft. Bowel sounds are normal. She exhibits no distension. There is no tenderness.  Musculoskeletal: Normal range of motion.  Neurological: She is alert and oriented to person, place, and time.  Skin: Skin is warm and dry.  Psychiatric: She has a normal mood and affect. Her behavior is normal.    Prenatal labs: ABO, Rh: --/--/O NEG (08/20 2345) Antibody: NEG (08/20 2345) Rubella: Immune (03/24 0000) RPR: Nonreactive (03/24 0000)  HBsAg: Negative (03/24 0000)  HIV: Non-reactive (03/24 0000)  GBS: Negative (08/03 0000)   Assessment/Plan: 38yo [id number] at 59  wk admitted for observation with contractions and no cervical change, h/o LTCS x 2   Bovard-Stuckert, Zendayah Hardgrave 03/27/2014, 6:28 AM

## 2014-03-27 NOTE — Progress Notes (Signed)
Ur chart review completed.  

## 2014-03-27 NOTE — Progress Notes (Signed)
Patient ID: Betty Ball, female   DOB: [birth year], 38 y.o.   MRN: [id number]  Admitted overnight with ctx, no cervical change.  1cm in MAU, 1 cm this am. Rates ctx as 5-7, waxing and waning ctx, but always uncomfortable  AFVSS gen NAD FHTs 130-140, good var, category 1 toco q 3-35min  D/w pt POC and monitoring for now. Some VB after SVE Pt declines pain meds Feklt ctx got worse after IVF

## 2014-03-27 NOTE — Progress Notes (Signed)
Patient ID: Betty Ball, female   DOB: 10-04-1975, 37 y.o.   MRN: [id number] This pt has a normal FHR tracing. There is no active bleeding. She states she had 2 cups of blood but I have seen only scant blood. She will be observed for now in antenatal.

## 2014-03-28 LAB — GLUCOSE, CAPILLARY: Glucose-Capillary: 75 mg/dL (ref 70–99)

## 2014-03-28 NOTE — Discharge Instructions (Signed)
No vaginal entrance. Call with heavy bleeding or severe pain or any other problems

## 2014-03-28 NOTE — Discharge Summary (Signed)
NAMEJAMESINA, GAUGH               ACCOUNT NO.:  [id number]  MEDICAL RECORD NO.:  [id number]  LOCATION:  9528                          FACILITY:  [facility]  PHYSICIAN:  Lucille Passy. Ulanda Edison, M.D. DATE OF BIRTH:  1976-04-15  DATE OF ADMISSION:  03/26/2014 DATE OF DISCHARGE:  03/28/2014                              DISCHARGE SUMMARY   This is a 38 year old white female, para 2-0-2-2, gravida 5, who was admitted for observation because of contractions, no cervical change, and subsequently had some bleeding after exam.  She did not feel comfortable going home, so she was kept in observation overnight and has had no more significant bleeding.  Fetal heart rate tracing for the last 12 hours has been completely normal with no decelerations and accelerations.  Patient is now ready for discharge.  Her hemoglobin on admission was 9.3, hematocrit 27.9, white count 11,100, platelet count of 184,000.  RPR nonreactive.  FINAL DIAGNOSES:  Intrauterine pregnancy at 37 weeks and 4 days with some uterine contractions and vaginal bleeding, now resolved.  OPERATIONS:  None.  FINAL CONDITION:  Improved.  INSTRUCTIONS:  Include our regular discharge instructions including no vaginal entrance, no heavy lifting, or strenuous activity.  Call with extremely heavy bleeding or severe contractions.  She is to resume her calcium, ferrous sulfate, levothyroxine, loratadine, prenatal vitamins, and ranitidine, and also continue her glyburide 5 mg in the morning and 2.5 mg at night.  She is to return to the office in 2 days for followup examination and another nonstress test.     Lucille Passy. Ulanda Edison, M.D.     TFH/MEDQ  D:  03/28/2014  T:  03/28/2014  Job:  [id number]

## 2014-03-28 NOTE — Progress Notes (Signed)
Patient ID: Betty Ball, female   DOB: June 07, 1976, 38 y.o.   MRN: [id number] Pt is ready for D/C the FHR strip is completely normal. She has had scant bloody d/c

## 2014-04-03 ENCOUNTER — Encounter (HOSPITAL_COMMUNITY)
Admission: RE | Admit: 2014-04-03 | Discharge: 2014-04-03 | Disposition: A | Discharge status: Home / Self Care | Payer: Medicaid Other | Source: Ambulatory Visit | Attending: Obstetrics and Gynecology | Admitting: Obstetrics and Gynecology

## 2014-04-03 ENCOUNTER — Encounter (HOSPITAL_COMMUNITY): Payer: Self-pay | Admitting: Pharmacy Technician

## 2014-04-03 ENCOUNTER — Encounter (HOSPITAL_COMMUNITY): Payer: Self-pay

## 2014-04-03 LAB — BASIC METABOLIC PANEL
Anion gap: 12 (ref 5–15)
BUN: 18 mg/dL (ref 6–23)
CO2: 21 meq/L (ref 19–32)
Calcium: 8.8 mg/dL (ref 8.4–10.5)
Chloride: 101 mEq/L (ref 96–112)
Creatinine, Ser: 1.93 mg/dL — ABNORMAL HIGH (ref 0.50–1.10)
GFR calc Af Amer: 37 mL/min — ABNORMAL LOW (ref >90)
GFR calc non Af Amer: 32 mL/min — ABNORMAL LOW (ref >90)
Glucose, Bld: 98 mg/dL (ref 70–99)
Potassium: 3.7 mEq/L (ref 3.7–5.3)
SODIUM: 134 meq/L — AB (ref 137–147)

## 2014-04-03 LAB — TYPE AND SCREEN
ABO/RH(D): O NEG
Antibody Screen: NEGATIVE

## 2014-04-03 LAB — RPR

## 2014-04-03 LAB — CBC
HEMATOCRIT: 29.1 % — AB (ref 36.0–46.0)
Hemoglobin: 9.7 g/dL — ABNORMAL LOW (ref 12.0–15.0)
MCH: 30.2 pg (ref 26.0–34.0)
MCHC: 33.3 g/dL (ref 30.0–36.0)
MCV: 90.7 fL (ref 78.0–100.0)
Platelets: 204 10*3/uL (ref 150–400)
RBC: 3.21 MIL/uL — ABNORMAL LOW (ref 3.87–5.11)
RDW: 14.2 % (ref 11.5–15.5)
WBC: 11.1 10*3/uL — ABNORMAL HIGH (ref 4.0–10.5)

## 2014-04-03 NOTE — Patient Instructions (Signed)
   Your procedure is scheduled on: AUG 31 AT 730AM  Enter through the Main Entrance of [facility] at:6AM Pick up the phone at the desk and dial [phone] and inform us of your arrival.  Please call this number if you have any problems the morning of surgery: [phone]  Remember: Do not eat food after midnight: Do not drink clear liquids after:MIDNIGHT Take these medicines the morning of surgery with a SIP OF WATER:  Do not wear jewelry, make-up, or FINGER nail polish No metal in your hair or on your body. Do not wear lotions, powders, perfumes.  You may wear deodorant.  Do not bring valuables to the hospital. Contacts, dentures or bridgework may not be worn into surgery.  Leave suitcase in the car. After Surgery it may be brought to your room. For patients being admitted to the hospital, checkout time is 11:00am the day of discharge.    Patients discharged on the day of surgery will not be allowed to drive home.

## 2014-04-05 NOTE — H&P (Signed)
Betty Ball is a 38 y.o. female, G5 P25, EGA [redacted] weeks with [facility] 9-7 presenting for repeat c-section and BTL.  2 previous c-sections, declines VBAC.  Received weekly 17-P for h/o 2 pre-term deliveries due to pre-term labor.  GDM controlled with Glyburide, reactive NSTs.  Has low-lying placenta, some bleeding recently with ctx.  See prenatal records for complete history.  Maternal Medical History:  Fetal activity: Perceived fetal activity is normal.    Prenatal Complications - Diabetes: gestational. Diabetes is managed by oral agent (monotherapy).      OB History   Grav Para Term Preterm Abortions TAB SAB Ect Mult Living   5 2  2 2  2         Past Medical History  Diagnosis Date  . Bicornate uterus   . 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 Hypothyroidism   . Rosacea   . Migraines   . Seasonal allergies   . Bursitis   . Depression   . Hiatal hernia   . TMJ (dislocation of temporomandibular joint)   . Endometriosis   . Polycystic ovarian disease   . Gestational diabetes    Past Surgical History  Procedure Laterality Date  . Cesarean section  2001, 2009  . Kidney surgery    . Laprascopic    . Wisdom tooth extraction     Family History: family history is not on file. Social History:  reports that she has never smoked. She has never used smokeless tobacco. She reports that she does not drink alcohol or use illicit drugs.   Prenatal Transfer Tool  Maternal Diabetes: Yes:  Diabetes Type:  Insulin/Medication controlled Genetic Screening: Normal Maternal Ultrasounds/Referrals: Normal Fetal Ultrasounds or other Referrals:  None Maternal Substance Abuse:  No Significant Maternal Medications:  Meds include: Other:  Significant Maternal Lab Results:  Lab values include: Group B Strep negative Other Comments:  On Glyburide for GDM  Review of Systems  Respiratory: Negative.   Cardiovascular: Negative.       Last menstrual period 07/04/2013. Maternal Exam:   Abdomen: Surgical scars: low transverse.   Estimated fetal weight is 8 lbs.   Fetal presentation: vertex  Introitus: Normal vulva. Normal vagina.    Physical Exam  Constitutional: She appears well-developed and well-nourished.  Neck: Neck supple. No thyromegaly present.  Cardiovascular: Normal rate, regular rhythm and normal heart sounds.   No murmur heard. Respiratory: Effort normal and breath sounds normal. No respiratory distress. She has no wheezes.  GI: Soft.    Prenatal labs: ABO, Rh: --/--/O NEG (08/28 0805) Antibody: NEG (08/28 0805) Rubella: Immune (03/24 0000) RPR: NON REAC (08/28 0806)  HBsAg: Negative (03/24 0000)  HIV: Non-reactive (03/24 0000)  GBS: Negative (08/03 0000)   Assessment/Plan: IUP at 39 weeks with 2 previous c-sections, for repeat c-section and BTL. Well controlled GDM with Glyburide.  C-section procedure and risks discussed.  BTL options, risks, permanency, failure rate discussed.  Will admit for repeat c-section and bilateral salpingectomies.  Creatinine on pre-op labs is elevated at 1.9, will follow postpartum and see if nephrology consult needed.     Ashwini Jago D 04/05/2014, 6:47 PM

## 2014-04-06 ENCOUNTER — Inpatient Hospital Stay (HOSPITAL_COMMUNITY): Payer: Medicaid Other | Admitting: Anesthesiology

## 2014-04-06 ENCOUNTER — Inpatient Hospital Stay (HOSPITAL_COMMUNITY)
Admission: RE | Admit: 2014-04-06 | Payer: Medicaid Other | Source: Ambulatory Visit | Admitting: Obstetrics and Gynecology

## 2014-04-06 ENCOUNTER — Encounter (HOSPITAL_COMMUNITY): Payer: Medicaid Other | Admitting: Anesthesiology

## 2014-04-06 ENCOUNTER — Encounter (HOSPITAL_COMMUNITY): Payer: Self-pay | Admitting: *Deleted

## 2014-04-06 ENCOUNTER — Encounter (HOSPITAL_COMMUNITY)
Admission: AD | Disposition: A | Discharge status: Home / Self Care | Payer: Self-pay | Source: Ambulatory Visit | Attending: Obstetrics and Gynecology

## 2014-04-06 ENCOUNTER — Inpatient Hospital Stay (HOSPITAL_COMMUNITY)
Admission: AD | Admit: 2014-04-06 | Discharge: 2014-04-09 | DRG: 765 | Disposition: A | Discharge status: Home / Self Care | Payer: Medicaid Other | Source: Ambulatory Visit | Attending: Obstetrics and Gynecology | Admitting: Obstetrics and Gynecology

## 2014-04-06 DIAGNOSIS — F329 Major depressive disorder, single episode, unspecified: Secondary | ICD-10-CM | POA: Diagnosis present

## 2014-04-06 DIAGNOSIS — E039 Hypothyroidism, unspecified: Secondary | ICD-10-CM | POA: Diagnosis present

## 2014-04-06 DIAGNOSIS — K449 Diaphragmatic hernia without obstruction or gangrene: Secondary | ICD-10-CM | POA: Diagnosis present

## 2014-04-06 DIAGNOSIS — O34219 Maternal care for unspecified type scar from previous cesarean delivery: Principal | ICD-10-CM | POA: Diagnosis present

## 2014-04-06 DIAGNOSIS — Q513 Bicornate uterus: Secondary | ICD-10-CM

## 2014-04-06 DIAGNOSIS — O99214 Obesity complicating childbirth: Secondary | ICD-10-CM | POA: Diagnosis present

## 2014-04-06 DIAGNOSIS — E079 Disorder of thyroid, unspecified: Secondary | ICD-10-CM | POA: Diagnosis present

## 2014-04-06 DIAGNOSIS — O26839 Pregnancy related renal disease, unspecified trimester: Secondary | ICD-10-CM | POA: Diagnosis present

## 2014-04-06 DIAGNOSIS — Z98891 History of uterine scar from previous surgery: Secondary | ICD-10-CM

## 2014-04-06 DIAGNOSIS — Z302 Encounter for sterilization: Secondary | ICD-10-CM | POA: Diagnosis not present

## 2014-04-06 DIAGNOSIS — O09529 Supervision of elderly multigravida, unspecified trimester: Secondary | ICD-10-CM | POA: Diagnosis present

## 2014-04-06 DIAGNOSIS — O3400 Maternal care for unspecified congenital malformation of uterus, unspecified trimester: Secondary | ICD-10-CM | POA: Diagnosis present

## 2014-04-06 DIAGNOSIS — F3289 Other specified depressive episodes: Secondary | ICD-10-CM | POA: Diagnosis present

## 2014-04-06 DIAGNOSIS — O99814 Abnormal glucose complicating childbirth: Secondary | ICD-10-CM | POA: Diagnosis present

## 2014-04-06 DIAGNOSIS — N289 Disorder of kidney and ureter, unspecified: Secondary | ICD-10-CM | POA: Diagnosis present

## 2014-04-06 DIAGNOSIS — O99284 Endocrine, nutritional and metabolic diseases complicating childbirth: Secondary | ICD-10-CM

## 2014-04-06 DIAGNOSIS — Z6841 Body Mass Index (BMI) 40.0 and over, adult: Secondary | ICD-10-CM | POA: Diagnosis not present

## 2014-04-06 DIAGNOSIS — O99344 Other mental disorders complicating childbirth: Secondary | ICD-10-CM | POA: Diagnosis present

## 2014-04-06 DIAGNOSIS — E669 Obesity, unspecified: Secondary | ICD-10-CM | POA: Diagnosis present

## 2014-04-06 DIAGNOSIS — Z9851 Tubal ligation status: Secondary | ICD-10-CM

## 2014-04-06 HISTORY — PX: BILATERAL SALPINGECTOMY: SHX5743

## 2014-04-06 LAB — CBC
HEMATOCRIT: 29.1 % — AB (ref 36.0–46.0)
Hemoglobin: 9.9 g/dL — ABNORMAL LOW (ref 12.0–15.0)
MCH: 30.2 pg (ref 26.0–34.0)
MCHC: 34 g/dL (ref 30.0–36.0)
MCV: 88.7 fL (ref 78.0–100.0)
Platelets: 222 10*3/uL (ref 150–400)
RBC: 3.28 MIL/uL — ABNORMAL LOW (ref 3.87–5.11)
RDW: 13.7 % (ref 11.5–15.5)
WBC: 15 10*3/uL — AB (ref 4.0–10.5)

## 2014-04-06 LAB — GLUCOSE, CAPILLARY
GLUCOSE-CAPILLARY: 132 mg/dL — AB (ref 70–99)
Glucose-Capillary: 83 mg/dL (ref 70–99)

## 2014-04-06 LAB — TYPE AND SCREEN
ABO/RH(D): O NEG
ANTIBODY SCREEN: NEGATIVE

## 2014-04-06 LAB — RPR

## 2014-04-06 SURGERY — Surgical Case
Anesthesia: Spinal | Site: Abdomen

## 2014-04-06 SURGERY — Surgical Case
Anesthesia: Regional

## 2014-04-06 MED ORDER — ONDANSETRON HCL 4 MG/2ML IJ SOLN
INTRAMUSCULAR | Status: AC
  Filled 2014-04-06: qty 2

## 2014-04-06 MED ORDER — DIPHENHYDRAMINE HCL 50 MG/ML IJ SOLN: 25.0000 mg | INTRAMUSCULAR | Status: DC | PRN

## 2014-04-06 MED ORDER — ONDANSETRON HCL 4 MG/2ML IJ SOLN: 4.0000 mg | INTRAMUSCULAR | Status: DC | PRN

## 2014-04-06 MED ORDER — SIMETHICONE 80 MG PO CHEW
80.0000 mg | CHEWABLE_TABLET | ORAL | Status: DC
  Administered 2014-04-07 – 2014-04-09 (×3): 80 mg via ORAL
  Filled 2014-04-06 (×3): qty 1

## 2014-04-06 MED ORDER — GENTAMICIN SULFATE 40 MG/ML IJ SOLN
INTRAVENOUS | Status: AC
  Administered 2014-04-06: 114.5 mL via INTRAVENOUS
  Filled 2014-04-06: qty 8.75

## 2014-04-06 MED ORDER — NALBUPHINE HCL 10 MG/ML IJ SOLN: 5.0000 mg | INTRAMUSCULAR | Status: DC | PRN

## 2014-04-06 MED ORDER — FAMOTIDINE 20 MG PO TABS
20.0000 mg | ORAL_TABLET | Freq: Every day | ORAL | Status: DC
  Administered 2014-04-06 – 2014-04-09 (×4): 20 mg via ORAL
  Filled 2014-04-06 (×4): qty 1

## 2014-04-06 MED ORDER — KETOROLAC TROMETHAMINE 30 MG/ML IJ SOLN: 30.0000 mg | Freq: Four times a day (QID) | INTRAMUSCULAR | Status: AC | PRN

## 2014-04-06 MED ORDER — OXYTOCIN 10 UNIT/ML IJ SOLN
INTRAMUSCULAR | Status: AC
  Filled 2014-04-06: qty 4

## 2014-04-06 MED ORDER — SODIUM CHLORIDE 0.9 % IJ SOLN: 3.0000 mL | INTRAMUSCULAR | Status: DC | PRN

## 2014-04-06 MED ORDER — PHENYLEPHRINE 8 MG IN D5W 100 ML (0.08MG/ML) PREMIX OPTIME
INJECTION | INTRAVENOUS | Status: AC
  Filled 2014-04-06: qty 100

## 2014-04-06 MED ORDER — MORPHINE SULFATE (PF) 0.5 MG/ML IJ SOLN
INTRAMUSCULAR | Status: DC | PRN
  Administered 2014-04-06: .15 mg via INTRAVENOUS

## 2014-04-06 MED ORDER — OXYCODONE-ACETAMINOPHEN 5-325 MG PO TABS
1.0000 | ORAL_TABLET | ORAL | Status: DC | PRN
  Administered 2014-04-07: 2 via ORAL
  Administered 2014-04-07 – 2014-04-09 (×3): 1 via ORAL
  Filled 2014-04-06 (×2): qty 1
  Filled 2014-04-06 (×2): qty 2
  Filled 2014-04-06: qty 1

## 2014-04-06 MED ORDER — DIPHENHYDRAMINE HCL 25 MG PO CAPS: 25.0000 mg | ORAL_CAPSULE | Freq: Four times a day (QID) | ORAL | Status: DC | PRN

## 2014-04-06 MED ORDER — SENNOSIDES-DOCUSATE SODIUM 8.6-50 MG PO TABS
2.0000 | ORAL_TABLET | ORAL | Status: DC
  Administered 2014-04-07 – 2014-04-09 (×3): 2 via ORAL
  Filled 2014-04-06 (×3): qty 2

## 2014-04-06 MED ORDER — MORPHINE SULFATE 0.5 MG/ML IJ SOLN
INTRAMUSCULAR | Status: AC
  Filled 2014-04-06: qty 10

## 2014-04-06 MED ORDER — KETOROLAC TROMETHAMINE 60 MG/2ML IM SOLN
60.0000 mg | Freq: Once | INTRAMUSCULAR | Status: AC | PRN
Start: 2014-04-06 — End: 2014-04-06
  Administered 2014-04-06: 60 mg via INTRAMUSCULAR

## 2014-04-06 MED ORDER — OXYTOCIN 40 UNITS IN LACTATED RINGERS INFUSION - SIMPLE MED: 62.5000 mL/h | INTRAVENOUS | Status: AC

## 2014-04-06 MED ORDER — PHENYLEPHRINE 40 MCG/ML (10ML) SYRINGE FOR IV PUSH (FOR BLOOD PRESSURE SUPPORT)
PREFILLED_SYRINGE | INTRAVENOUS | Status: AC
  Filled 2014-04-06: qty 5

## 2014-04-06 MED ORDER — DIPHENHYDRAMINE HCL 50 MG/ML IJ SOLN
INTRAMUSCULAR | Status: DC | PRN
  Administered 2014-04-06: 25 mg via INTRAVENOUS

## 2014-04-06 MED ORDER — METOCLOPRAMIDE HCL 5 MG/ML IJ SOLN
10.0000 mg | Freq: Three times a day (TID) | INTRAMUSCULAR | Status: DC | PRN
  Administered 2014-04-06: 10 mg via INTRAVENOUS
  Filled 2014-04-06: qty 2

## 2014-04-06 MED ORDER — NALOXONE HCL 1 MG/ML IJ SOLN
1.0000 ug/kg/h | INTRAVENOUS | Status: DC | PRN
  Filled 2014-04-06: qty 2

## 2014-04-06 MED ORDER — WITCH HAZEL-GLYCERIN EX PADS
1.0000 | MEDICATED_PAD | CUTANEOUS | Status: DC | PRN
Start: 2014-04-06 — End: 2014-04-09

## 2014-04-06 MED ORDER — DIPHENHYDRAMINE HCL 25 MG PO CAPS: 25.0000 mg | ORAL_CAPSULE | ORAL | Status: DC | PRN

## 2014-04-06 MED ORDER — SCOPOLAMINE 1 MG/3DAYS TD PT72
1.0000 | MEDICATED_PATCH | Freq: Once | TRANSDERMAL | Status: AC
  Administered 2014-04-06: 1.5 mg via TRANSDERMAL

## 2014-04-06 MED ORDER — DIPHENHYDRAMINE HCL 50 MG/ML IJ SOLN: 12.5000 mg | INTRAMUSCULAR | Status: DC | PRN

## 2014-04-06 MED ORDER — LACTATED RINGERS IV SOLN
INTRAVENOUS | Status: DC
  Administered 2014-04-06 (×2): via INTRAVENOUS

## 2014-04-06 MED ORDER — MENTHOL 3 MG MT LOZG
1.0000 | LOZENGE | OROMUCOSAL | Status: DC | PRN
Start: 2014-04-06 — End: 2014-04-09

## 2014-04-06 MED ORDER — PHENYLEPHRINE HCL 10 MG/ML IJ SOLN
INTRAMUSCULAR | Status: DC | PRN
  Administered 2014-04-06: 80 ug via INTRAVENOUS

## 2014-04-06 MED ORDER — CALCIUM CARBONATE ANTACID 500 MG PO CHEW: 1.0000 | CHEWABLE_TABLET | Freq: Every day | ORAL | Status: DC | PRN

## 2014-04-06 MED ORDER — TETANUS-DIPHTH-ACELL PERTUSSIS 5-2.5-18.5 LF-MCG/0.5 IM SUSP: 0.5000 mL | Freq: Once | INTRAMUSCULAR | Status: DC

## 2014-04-06 MED ORDER — OXYTOCIN 10 UNIT/ML IJ SOLN
40.0000 [IU] | INTRAVENOUS | Status: DC | PRN
  Administered 2014-04-06: 40 [IU] via INTRAVENOUS

## 2014-04-06 MED ORDER — IBUPROFEN 800 MG PO TABS
800.0000 mg | ORAL_TABLET | Freq: Three times a day (TID) | ORAL | Status: DC
Start: 2014-04-06 — End: 2014-04-09
  Administered 2014-04-07 – 2014-04-09 (×7): 800 mg via ORAL
  Filled 2014-04-06 (×10): qty 1

## 2014-04-06 MED ORDER — PROMETHAZINE HCL 25 MG/ML IJ SOLN: 6.2500 mg | INTRAMUSCULAR | Status: DC | PRN

## 2014-04-06 MED ORDER — DIPHENHYDRAMINE HCL 50 MG/ML IJ SOLN
INTRAMUSCULAR | Status: AC
  Filled 2014-04-06: qty 1

## 2014-04-06 MED ORDER — LACTATED RINGERS IV BOLUS (SEPSIS)
500.0000 mL | Freq: Once | INTRAVENOUS | Status: AC
  Administered 2014-04-06: 500 mL via INTRAVENOUS

## 2014-04-06 MED ORDER — DIBUCAINE 1 % RE OINT
1.0000 | TOPICAL_OINTMENT | RECTAL | Status: DC | PRN
Start: 2014-04-06 — End: 2014-04-09

## 2014-04-06 MED ORDER — CITRIC ACID-SODIUM CITRATE 334-500 MG/5ML PO SOLN
30.0000 mL | Freq: Once | ORAL | Status: AC
  Administered 2014-04-06: 30 mL via ORAL
  Filled 2014-04-06: qty 15

## 2014-04-06 MED ORDER — KETOROLAC TROMETHAMINE 60 MG/2ML IM SOLN
INTRAMUSCULAR | Status: AC
  Filled 2014-04-06: qty 2

## 2014-04-06 MED ORDER — MEPERIDINE HCL 25 MG/ML IJ SOLN: 6.2500 mg | INTRAMUSCULAR | Status: DC | PRN

## 2014-04-06 MED ORDER — BUPIVACAINE IN DEXTROSE 0.75-8.25 % IT SOLN
INTRATHECAL | Status: DC | PRN
  Administered 2014-04-06: 1.6 mL via INTRATHECAL

## 2014-04-06 MED ORDER — FENTANYL CITRATE 0.05 MG/ML IJ SOLN
INTRAMUSCULAR | Status: AC
  Filled 2014-04-06: qty 2

## 2014-04-06 MED ORDER — FENTANYL CITRATE 0.05 MG/ML IJ SOLN
INTRAMUSCULAR | Status: DC | PRN
  Administered 2014-04-06: 15 ug via INTRATHECAL

## 2014-04-06 MED ORDER — LANOLIN HYDROUS EX OINT: 1.0000 "application " | TOPICAL_OINTMENT | CUTANEOUS | Status: DC | PRN

## 2014-04-06 MED ORDER — LACTATED RINGERS IV SOLN
INTRAVENOUS | Status: DC
  Administered 2014-04-06: 14:00:00 via INTRAVENOUS

## 2014-04-06 MED ORDER — PRENATAL MULTIVITAMIN CH
1.0000 | ORAL_TABLET | Freq: Every day | ORAL | Status: DC
  Administered 2014-04-08: 1 via ORAL
  Filled 2014-04-06 (×3): qty 1

## 2014-04-06 MED ORDER — ZOLPIDEM TARTRATE 5 MG PO TABS: 5.0000 mg | ORAL_TABLET | Freq: Every evening | ORAL | Status: DC | PRN

## 2014-04-06 MED ORDER — SIMETHICONE 80 MG PO CHEW
80.0000 mg | CHEWABLE_TABLET | Freq: Three times a day (TID) | ORAL | Status: DC
  Administered 2014-04-07 – 2014-04-09 (×6): 80 mg via ORAL
  Filled 2014-04-06 (×9): qty 1

## 2014-04-06 MED ORDER — SCOPOLAMINE 1 MG/3DAYS TD PT72
MEDICATED_PATCH | TRANSDERMAL | Status: AC
  Filled 2014-04-06: qty 1

## 2014-04-06 MED ORDER — MEPERIDINE HCL 25 MG/ML IJ SOLN
INTRAMUSCULAR | Status: AC
  Filled 2014-04-06: qty 1

## 2014-04-06 MED ORDER — ONDANSETRON HCL 4 MG/2ML IJ SOLN
INTRAMUSCULAR | Status: DC | PRN
  Administered 2014-04-06: 4 mg via INTRAVENOUS

## 2014-04-06 MED ORDER — FAMOTIDINE IN NACL 20-0.9 MG/50ML-% IV SOLN
20.0000 mg | Freq: Once | INTRAVENOUS | Status: AC
  Administered 2014-04-06: 20 mg via INTRAVENOUS
  Filled 2014-04-06: qty 50

## 2014-04-06 MED ORDER — HYDROMORPHONE HCL PF 1 MG/ML IJ SOLN: 0.2500 mg | INTRAMUSCULAR | Status: DC | PRN

## 2014-04-06 MED ORDER — MEPERIDINE HCL 25 MG/ML IJ SOLN
INTRAMUSCULAR | Status: DC | PRN
  Administered 2014-04-06 (×2): 12.5 mg via INTRAVENOUS

## 2014-04-06 MED ORDER — PHENYLEPHRINE 8 MG IN D5W 100 ML (0.08MG/ML) PREMIX OPTIME
INJECTION | INTRAVENOUS | Status: DC | PRN
  Administered 2014-04-06: 40 ug/min via INTRAVENOUS

## 2014-04-06 MED ORDER — LORATADINE 10 MG PO TABS
10.0000 mg | ORAL_TABLET | Freq: Every day | ORAL | Status: DC
  Administered 2014-04-06: 10 mg via ORAL
  Filled 2014-04-06 (×2): qty 1

## 2014-04-06 MED ORDER — ONDANSETRON HCL 4 MG/2ML IJ SOLN: 4.0000 mg | Freq: Three times a day (TID) | INTRAMUSCULAR | Status: DC | PRN

## 2014-04-06 MED ORDER — LEVOTHYROXINE SODIUM 25 MCG PO TABS
25.0000 ug | ORAL_TABLET | Freq: Every day | ORAL | Status: DC
  Administered 2014-04-06: 25 ug via ORAL
  Filled 2014-04-06 (×2): qty 1

## 2014-04-06 MED ORDER — NALOXONE HCL 0.4 MG/ML IJ SOLN: 0.4000 mg | INTRAMUSCULAR | Status: DC | PRN

## 2014-04-06 MED ORDER — SIMETHICONE 80 MG PO CHEW
80.0000 mg | CHEWABLE_TABLET | ORAL | Status: DC | PRN
  Administered 2014-04-09: 80 mg via ORAL

## 2014-04-06 MED ORDER — ONDANSETRON HCL 4 MG PO TABS
4.0000 mg | ORAL_TABLET | ORAL | Status: DC | PRN
  Administered 2014-04-07: 4 mg via ORAL
  Filled 2014-04-06: qty 1

## 2014-04-06 SURGICAL SUPPLY — 37 items
APL SKNCLS STERI-STRIP NONHPOA (GAUZE/BANDAGES/DRESSINGS) ×1
BENZOIN TINCTURE PRP APPL 2/3 (GAUZE/BANDAGES/DRESSINGS) ×1 IMPLANT
BLADE SURG 10 STRL SS (BLADE) ×4 IMPLANT
CHLORAPREP W/TINT 26ML (MISCELLANEOUS) ×1 IMPLANT
CLAMP CORD UMBIL (MISCELLANEOUS) IMPLANT
CLOTH BEACON ORANGE TIMEOUT ST (SAFETY) ×2 IMPLANT
CONTAINER PREFILL 10% NBF 15ML (MISCELLANEOUS) IMPLANT
DRAPE LG THREE QUARTER DISP (DRAPES) IMPLANT
DRSG OPSITE POSTOP 4X10 (GAUZE/BANDAGES/DRESSINGS) ×2 IMPLANT
DURAPREP 26ML APPLICATOR (WOUND CARE) ×1 IMPLANT
ELECT REM PT RETURN 9FT ADLT (ELECTROSURGICAL) ×2
ELECTRODE REM PT RTRN 9FT ADLT (ELECTROSURGICAL) ×1 IMPLANT
EXTRACTOR VACUUM M CUP 4 TUBE (SUCTIONS) IMPLANT
GLOVE BIO SURGEON STRL SZ 6.5 (GLOVE) ×2 IMPLANT
GOWN STRL REUS W/TWL LRG LVL3 (GOWN DISPOSABLE) ×4 IMPLANT
KIT ABG SYR 3ML LUER SLIP (SYRINGE) IMPLANT
NDL HYPO 25X5/8 SAFETYGLIDE (NEEDLE) IMPLANT
NEEDLE HYPO 25X5/8 SAFETYGLIDE (NEEDLE) IMPLANT
NS IRRIG 1000ML POUR BTL (IV SOLUTION) ×2 IMPLANT
PACK C SECTION WH (CUSTOM PROCEDURE TRAY) ×2 IMPLANT
PAD OB MATERNITY 4.3X12.25 (PERSONAL CARE ITEMS) ×2 IMPLANT
RTRCTR C-SECT PINK 25CM LRG (MISCELLANEOUS) ×2 IMPLANT
STAPLER VISISTAT 35W (STAPLE) IMPLANT
STRIP CLOSURE SKIN 1/2X4 (GAUZE/BANDAGES/DRESSINGS) ×1 IMPLANT
SUT MNCRL 0 VIOLET CTX 36 (SUTURE) ×2 IMPLANT
SUT MONOCRYL 0 CTX 36 (SUTURE) ×2
SUT PLAIN 1 NONE 54 (SUTURE) ×1 IMPLANT
SUT PLAIN 2 0 XLH (SUTURE) ×2 IMPLANT
SUT VIC AB 0 CT1 27 (SUTURE) ×4
SUT VIC AB 0 CT1 27XBRD ANBCTR (SUTURE) ×2 IMPLANT
SUT VIC AB 2-0 CT1 27 (SUTURE) ×2
SUT VIC AB 2-0 CT1 TAPERPNT 27 (SUTURE) ×1 IMPLANT
SUT VIC AB 4-0 KS 27 (SUTURE) ×1 IMPLANT
SYR BULB IRRIGATION 50ML (SYRINGE) ×2 IMPLANT
TOWEL OR 17X24 6PK STRL BLUE (TOWEL DISPOSABLE) ×2 IMPLANT
TRAY FOLEY CATH 14FR (SET/KITS/TRAYS/PACK) ×2 IMPLANT
WATER STERILE IRR 1000ML POUR (IV SOLUTION) ×2 IMPLANT

## 2014-04-06 NOTE — Op Note (Signed)
Betty Ball, Betty Ball               ACCOUNT NO.:  [id number]  MEDICAL RECORD NO.:  [id number]  LOCATION:  [facility]                          FACILITY:  [facility]  PHYSICIAN:  Thornell Sartorius, MD        DATE OF BIRTH:  1975-11-01  DATE OF PROCEDURE:  04/06/2014 DATE OF DISCHARGE:                              OPERATIVE REPORT   PREOPERATIVE DIAGNOSES:  Intrauterine pregnancy at term, history of low- transverse cesarean section x2, undesired fertility.  POSTOPERATIVE DIAGNOSES:  Intrauterine pregnancy at term, history of low- transverse cesarean section x2, undesired fertility, delivered. Bilateral tubal ligation by bilateral salpingectomy.  PROCEDURE:  Repeat low-transverse cesarean section, bilateral salpingectomy.  SURGEON:  Thornell Sartorius, MD  ANESTHESIA:  Spinal.  FINDINGS:  Viable female infant at 4:28 a.m. with Apgars pending and weight pending at the time of dictation.  Normal uterus, tubes, and ovaries are noted.  PATHOLOGY:  Placenta to L and D, bilateral tubal segments to pathology.  COMPLICATIONS:  None.  PROCEDURE:  After informed consent was reviewed with the patient and her spouse, she was transported to the OR, where spinal anesthesia was placed and found to be adequate.  She was then prepped and draped in the normal sterile fashion.  A Pfannenstiel incision was made at the level of her previous incision, carried through the underlying layer of fascia sharply.  The fascia was incised in the midline.  This incision was extended laterally with Mayo scissors.  The superior aspect of the fascial incision was grasped with clamps, elevated, and the rectus muscles were dissected off both bluntly and sharply.  Midline was easily identified.  The peritoneum was entered bluntly.  This incision was extended superiorly and inferiorly with good visualization of the bladder.  Some bladder to lower uterine segment adhesions were noted and lysed.  The Alexis skin retractor was placed carefully  making sure no bowel was entrapped.  Uterus was incised in a transverse fashion. Infant was delivered from a complete breech presentation without difficulty.  Nose and mouth were suctioned on the field.  Cord was clamped and cut.  Infant was handed off to the awaiting pediatric staff. The placenta was expressed.  The uterus was cleared of all clot and debris.  The uterine incision was closed with 2 layers of 0 Monocryl, the first of which was a running locked and the second as an imbricating layer.  This was noted to be hemostatic.  Bilateral salpingectomy was performed initially at the left.  The tube was easily identified followed out to the fimbriated end using a Claiborne Billings, was doubly ligated with 0 plain gut.  The tubal segment was sent to pathology.  This was performed also on the right.  Hemostasis was assured.  The peritoneum was reapproximated with 2-0 Vicryl in a running fashion.  The subfascial planes were inspected, found to be hemostatic.  The fascia was closed with 0 Vicryl as a running suture.  The subcuticular adipose layer was made hemostatic with Bovie cautery.  The dead space was closed with plain gut.  The skin was closed with 4-0 Vicryl.  Benzoin and Steri- Strips.  The patient tolerated the procedure well.  Sponge, lap, and  needle counts were correct x2 per the operating staff.     Thornell Sartorius, MD     JB/MEDQ  D:  04/06/2014  T:  04/06/2014  Job:  [id number]

## 2014-04-06 NOTE — Anesthesia Procedure Notes (Signed)
Spinal  Patient location during procedure: OR Staffing Anesthesiologist: Nolon Nations R Performed by: anesthesiologist  Preanesthetic Checklist Completed: patient identified, site marked, surgical consent, pre-op evaluation, timeout performed, IV checked, risks and benefits discussed and monitors and equipment checked Spinal Block Patient position: sitting Prep: Betadine Patient monitoring: heart rate, continuous pulse ox and blood pressure Location: L3-4 Injection technique: single-shot Needle Needle type: Sprotte  Needle gauge: 24 G Needle length: 9 cm Assessment Sensory level: T6 Additional Notes Expiration date of kit checked and confirmed. Patient tolerated procedure well, without complications.

## 2014-04-06 NOTE — Brief Op Note (Signed)
04/06/2014  5:18 AM  PATIENT:  Betty Ball  38 y.o. female  PRE-OPERATIVE DIAGNOSIS:  repeat cesarean section and bilateral salpingectomy  POST-OPERATIVE DIAGNOSIS:  repeat cesarean section and bilateral salpingectomy  PROCEDURE:  Procedure(s): CESAREAN SECTION (N/A) BILATERAL SALPINGECTOMY (N/A)  SURGEON:  Surgeon(s) and Role:    * Shamyia Grandpre Bovard-Stuckert, MD - Primary  ANESTHESIA:   spinal  FINDINGS: viable female infant at 4:28am, apgars P, Wt P; nl uterus, tubes and ovaries  EBL:  Total I/O In: 2500 [I.V.:2500] Out: 700 [Urine:100; Blood:600]  BLOOD ADMINISTERED:none  DRAINS: Urinary Catheter (Foley)   LOCAL MEDICATIONS USED:  NONE  SPECIMEN:  Source of Specimen:  Placenta, B tubal segments  DISPOSITION OF SPECIMEN:  L&D, pathology  COUNTS:  YES  TOURNIQUET:  * No tourniquets in log *  DICTATION: .Other Dictation: Dictation Number [id number]  PLAN OF CARE: Admit to inpatient   PATIENT DISPOSITION:  PACU - hemodynamically stable.   Delay start of Pharmacological VTE agent (>24hrs) due to surgical blood loss or risk of bleeding: not applicable

## 2014-04-06 NOTE — Anesthesia Postprocedure Evaluation (Signed)
  Anesthesia Post-op Note Anesthesia Post Note  Patient: Betty Ball  Procedure(s) Performed: Procedure(s) (LRB): CESAREAN SECTION (N/A) BILATERAL SALPINGECTOMY (N/A)  Anesthesia type: Spinal  Patient location: PACU  Post pain: Pain level controlled  Post assessment: Post-op Vital signs reviewed  Last Vitals:  Filed Vitals:   04/06/14 0715  BP: 108/66  Pulse: 83  Temp: 36.6 C  Resp: 17    Post vital signs: Reviewed  Level of consciousness: awake  Complications: No apparent anesthesia complications

## 2014-04-06 NOTE — Lactation Note (Signed)
This note was copied from the chart of Betty Claris Gower. Lactation Consultation Note  Mom is currently in the NICU visiting baby.  Baby was transferred to NICU this AM for low blood sugars.  Mom has been on glyburide during pregnancy.  MBU RN has initiated DEBP .  Lactation services and support information along with Providing Breastmilk for Your Baby In NICU booklet left at bedside.  Will follow up.  Patient Name: Betty Ball Today's Date: 04/06/2014     Maternal Data    Feeding Feeding Type: Formula Nipple Type: Regular Length of feed: 15 min  LATCH Score/Interventions                      Lactation Tools Discussed/Used     Consult Status      Ave Filter 04/06/2014, 3:13 PM

## 2014-04-06 NOTE — MAU Provider Note (Signed)
  Pt presents with ROM for meconium.  SVE changed from last office exam.  For rLTCS/BTL at 7:30am.  D/W pt r/b/a of rLTCS and BTL including, but not limited to bleeding, infection, damage to surrounding organs, injury to infant, trouble healing.  Pt desires BTL by B salpingectomy.    FHTs 150 R, category 1 toco Q 16min  FSBS = 83 Will follow CR, elevated on pre-op labs.

## 2014-04-06 NOTE — Anesthesia Preprocedure Evaluation (Signed)
Anesthesia Evaluation  Patient identified by MRN, date of birth, ID band Patient awake    Reviewed: Allergy & Precautions, H&P , NPO status , Patient's Chart, lab work & pertinent test results  History of Anesthesia Complications (+) PONV  Airway Mallampati: II TM Distance: >3 FB Neck ROM: Full    Dental no notable dental hx.    Pulmonary neg pulmonary ROS,  breath sounds clear to auscultation  Pulmonary exam normal       Cardiovascular negative cardio ROS  Rhythm:Regular Rate:Normal     Neuro/Psych  Headaches, PSYCHIATRIC DISORDERS Depression    GI/Hepatic Neg liver ROS, hiatal hernia,   Endo/Other  diabetes, GestationalHypothyroidism Morbid obesity  Renal/GU Renal disease     Musculoskeletal negative musculoskeletal ROS (+)   Abdominal (+) + obese,   Peds  Hematology negative hematology ROS (+)   Anesthesia Other Findings   Reproductive/Obstetrics (+) Pregnancy                           Anesthesia Physical Anesthesia Plan  ASA: III and emergent  Anesthesia Plan: Spinal   Post-op Pain Management:    Induction:   Airway Management Planned: Natural Airway  Additional Equipment:   Intra-op Plan:   Post-operative Plan:   Informed Consent: I have reviewed the patients History and Physical, chart, labs and discussed the procedure including the risks, benefits and alternatives for the proposed anesthesia with the patient or authorized representative who has indicated his/her understanding and acceptance.     Plan Discussed with: CRNA and Surgeon  Anesthesia Plan Comments: (Surgeon feels emergency due to pts, two prior c/s and meconium stained amniotic fluid)        Anesthesia Quick Evaluation

## 2014-04-06 NOTE — Lactation Note (Signed)
This note was copied from the chart of Betty Claris Gower. Lactation Consultation Note      Initial consult with this mom of a NICU baby, now 68 hours old, and [redacted] weeks gestation. Mom was holding baby skin to skin while I did teaching on pumping for a  baby in the NICU, how to set premie setting, hand expression done - mom expressed about 1 mls easily in about 1 minutes. I will follow up with mom and baby tomorrow. Mom reports her goal to provide EBM for about the first 6 weeks of life. Mom is active with [facility], and info will be faxed to them for a DEP.  Patient Name: Betty Ball Today's Date: 04/06/2014 Reason for consult: Initial assessment;NICU baby   Maternal Data Formula Feeding for Exclusion: Yes (baby in the NICU for hypoglycemia) Has patient been taught Hand Expression?: Yes Does the patient have breastfeeding experience prior to this delivery?: Yes  Feeding Feeding Type: Formula Nipple Type: Regular Length of feed: 20 min  LATCH Score/Interventions                      Lactation Tools Discussed/Used WIC Program: Yes ([place] - to fax info on mom ofr DEP) Pump Review: Setup, frequency, and cleaning;Milk Storage;Other (comment) (premie setting, hand expression, review of NICU booklet) Initiated by:: bedside RN Date initiated:: 04/06/14   Consult Status Consult Status: Follow-up Date: 04/07/14 Follow-up type: In-patient    Tonna Corner 04/06/2014, 4:34 PM

## 2014-04-06 NOTE — Consult Note (Signed)
Neonatology Note:   Attendance at C-section:    I was asked by Dr. Sandford Craze to attend this repeat C/S at term (scheduled for later today, but patient had SROM). The mother is a G5P2A2 O neg, GBS neg with GDM, on glyburide, bicornuate uterus, hypothyroidism, endometriosis, PCOD, and a history of depression. ROM 3 hours prior to delivery, fluid with thin meconium. Infant vigorous with good spontaneous cry and tone. Needed only minimal bulb suctioning. Ap 8/9. Lungs clear to ausc in DR. To CN to care of Pediatrician.   Real Cons, MD

## 2014-04-06 NOTE — Transfer of Care (Signed)
Immediate Anesthesia Transfer of Care Note  Patient: Betty Ball  Procedure(s) Performed: Procedure(s): CESAREAN SECTION (N/A) BILATERAL SALPINGECTOMY (N/A)  Patient Location: PACU  Anesthesia Type:Spinal  Level of Consciousness: awake, alert  and oriented  Airway & Oxygen Therapy: Patient Spontanous Breathing  Post-op Assessment: Report given to PACU RN and Post -op Vital signs reviewed and stable  Post vital signs: Reviewed and stable  Complications: No apparent anesthesia complications

## 2014-04-07 ENCOUNTER — Ambulatory Visit (HOSPITAL_COMMUNITY): Payer: Medicaid Other

## 2014-04-07 ENCOUNTER — Encounter (HOSPITAL_COMMUNITY): Payer: Self-pay | Admitting: Obstetrics and Gynecology

## 2014-04-07 LAB — URINALYSIS, ROUTINE W REFLEX MICROSCOPIC
BILIRUBIN URINE: NEGATIVE
GLUCOSE, UA: NEGATIVE mg/dL
KETONES UR: NEGATIVE mg/dL
Leukocytes, UA: NEGATIVE
Nitrite: NEGATIVE
PH: 6 (ref 5.0–8.0)
Protein, ur: NEGATIVE mg/dL
Specific Gravity, Urine: <1.005 — ABNORMAL LOW (ref 1.005–1.030)
Urobilinogen, UA: 0.2 mg/dL (ref 0.0–1.0)

## 2014-04-07 LAB — BASIC METABOLIC PANEL
ANION GAP: 11 (ref 5–15)
ANION GAP: 11 (ref 5–15)
BUN: 24 mg/dL — AB (ref 6–23)
BUN: 24 mg/dL — AB (ref 6–23)
CHLORIDE: 103 meq/L (ref 96–112)
CO2: 24 mEq/L (ref 19–32)
CO2: 24 meq/L (ref 19–32)
CREATININE: 2.37 mg/dL — AB (ref 0.50–1.10)
CREATININE: 2.48 mg/dL — AB (ref 0.50–1.10)
Calcium: 8.9 mg/dL (ref 8.4–10.5)
Calcium: 8.9 mg/dL (ref 8.4–10.5)
Chloride: 103 mEq/L (ref 96–112)
GFR calc Af Amer: 29 mL/min — ABNORMAL LOW (ref >90)
GFR calc Af Amer: 27 mL/min — ABNORMAL LOW (ref >90)
GFR calc non Af Amer: 25 mL/min — ABNORMAL LOW (ref >90)
GFR calc non Af Amer: 24 mL/min — ABNORMAL LOW (ref >90)
GLUCOSE: 138 mg/dL — AB (ref 70–99)
Glucose, Bld: 74 mg/dL (ref 70–99)
POTASSIUM: 4.2 meq/L (ref 3.7–5.3)
Potassium: 4.4 mEq/L (ref 3.7–5.3)
Sodium: 138 mEq/L (ref 137–147)
Sodium: 138 mEq/L (ref 137–147)

## 2014-04-07 LAB — CBC
HCT: 21.4 % — ABNORMAL LOW (ref 36.0–46.0)
Hemoglobin: 7.2 g/dL — ABNORMAL LOW (ref 12.0–15.0)
MCH: 30.3 pg (ref 26.0–34.0)
MCHC: 33.6 g/dL (ref 30.0–36.0)
MCV: 89.9 fL (ref 78.0–100.0)
Platelets: 142 10*3/uL — ABNORMAL LOW (ref 150–400)
RBC: 2.38 MIL/uL — ABNORMAL LOW (ref 3.87–5.11)
RDW: 14.1 % (ref 11.5–15.5)
WBC: 11.3 10*3/uL — ABNORMAL HIGH (ref 4.0–10.5)

## 2014-04-07 LAB — URINE MICROSCOPIC-ADD ON

## 2014-04-07 LAB — BIRTH TISSUE RECOVERY COLLECTION (PLACENTA DONATION)

## 2014-04-07 MED ORDER — RHO D IMMUNE GLOBULIN 1500 UNIT/2ML IJ SOSY
300.0000 ug | PREFILLED_SYRINGE | Freq: Once | INTRAMUSCULAR | Status: AC
  Administered 2014-04-07: 300 ug via INTRAMUSCULAR
  Filled 2014-04-07: qty 2

## 2014-04-07 MED ORDER — CHLORHEXIDINE GLUCONATE 4 % EX LIQD
Freq: Once | CUTANEOUS | Status: AC
  Administered 2014-04-07: 11:00:00 via TOPICAL
  Filled 2014-04-07: qty 118

## 2014-04-07 MED ORDER — LEVOTHYROXINE SODIUM 25 MCG PO TABS
25.0000 ug | ORAL_TABLET | Freq: Every day | ORAL | Status: DC
  Administered 2014-04-07 – 2014-04-08 (×2): 25 ug via ORAL
  Filled 2014-04-07 (×2): qty 1

## 2014-04-07 MED ORDER — LORATADINE 10 MG PO TABS
10.0000 mg | ORAL_TABLET | Freq: Every day | ORAL | Status: DC
  Administered 2014-04-07 – 2014-04-08 (×2): 10 mg via ORAL
  Filled 2014-04-07 (×2): qty 1

## 2014-04-07 NOTE — Progress Notes (Signed)
Rhogam given lot # [id number] exp date 05/26/16 given in rt gluteal.

## 2014-04-07 NOTE — Progress Notes (Signed)
Notified Dr. Melba Coon of BMP, U/A, renal ultrasound results. No new orders at this time.

## 2014-04-07 NOTE — Progress Notes (Addendum)
Subjective: Postpartum Day 1: Cesarean Delivery Patient reports incisional pain and tolerating PO.  Nl lochia, pain controlled.  Cr 1.93 - 2.37  Objective: Vital signs in last 24 hours: Temp:  [97.9 F (36.6 C)-98.3 F (36.8 C)] 98.1 F (36.7 C) (09/01 0430) Pulse Rate:  [57-86] 83 (09/01 0430) Resp:  [17-20] 18 (09/01 0430) BP: (92-114)/(41-81) 114/81 mmHg (09/01 0430) SpO2:  [95 %-100 %] 100 % (09/01 0430)  Physical Exam:  General: alert and no distress Lochia: appropriate Uterine Fundus: firm Incision: healing well DVT Evaluation: No evidence of DVT seen on physical exam.   Recent Labs  04/06/14 0300 04/07/14 0600  HGB 9.9* 7.2*  HCT 29.1* 21.4*    Assessment/Plan: Status post Cesarean section. Doing well postoperatively.  Continue current care.  Will call nephrology consult.    Bovard-Stuckert, Josaiah Muhammed 04/07/2014, 7:42 AM  D/W Dr Powell/Dr Deterding.  Will check UA, renal US, BMP this PM, will consult with other abnormalities.  (Dr. Keturah Barre [phone])

## 2014-04-07 NOTE — Addendum Note (Signed)
Addendum created 04/07/14 0820 by Raenette Rover, CRNA   Modules edited: Notes Section   Notes Section:  File: [id number]; File: [id number]

## 2014-04-07 NOTE — Plan of Care (Signed)
Problem: Discharge Progression Outcomes Goal: Barriers To Progression Addressed/Resolved Outcome: Progressing Pt is having kidnay problems which Dr. Belenda Cruise is aware of. These are chronic. Nephrologist Dr. Encarnacion Slates was consulted and labs are ordered for am. Pr continues to be monitored for intake and output.

## 2014-04-07 NOTE — Progress Notes (Signed)
UR chart review completed.  

## 2014-04-07 NOTE — Anesthesia Postprocedure Evaluation (Addendum)
  Anesthesia Post-op Note  Patient: Betty Ball  Procedure(s) Performed: Procedure(s): CESAREAN SECTION (N/A) BILATERAL SALPINGECTOMY (N/A)  Patient Location: Mother/Baby  Anesthesia Type:Regional  Level of Consciousness: awake, alert , oriented and patient cooperative  Airway and Oxygen Therapy: Patient Spontanous Breathing  Post-op Pain: none  Post-op Assessment: Post-op Vital signs reviewed, No headache, No backache, No residual numbness and No residual motor weakness Patient complaining of blurred vision, possibly d/t scopolamine patch.  Pupils bilaterally dilated, R>L.  Pt reports she took her patch off last night and has touched her eyes after touching patch.  Dr. Rudean Curt made aware and will follow up.  Post-op Vital Signs: Reviewed and stable  Last Vitals:  Filed Vitals:   04/07/14 0430  BP: 114/81  Pulse: 83  Temp: 36.7 C  Resp: 18    Complications: No apparent anesthesia complications

## 2014-04-08 LAB — BASIC METABOLIC PANEL
ANION GAP: 12 (ref 5–15)
BUN: 25 mg/dL — ABNORMAL HIGH (ref 6–23)
CALCIUM: 8.9 mg/dL (ref 8.4–10.5)
CHLORIDE: 101 meq/L (ref 96–112)
CO2: 24 meq/L (ref 19–32)
Creatinine, Ser: 2.49 mg/dL — ABNORMAL HIGH (ref 0.50–1.10)
GFR calc Af Amer: 27 mL/min — ABNORMAL LOW (ref >90)
GFR calc non Af Amer: 24 mL/min — ABNORMAL LOW (ref >90)
Glucose, Bld: 87 mg/dL (ref 70–99)
Potassium: 4.3 mEq/L (ref 3.7–5.3)
SODIUM: 137 meq/L (ref 137–147)

## 2014-04-08 LAB — RH IG WORKUP (INCLUDES ABO/RH)
ABO/RH(D): O NEG
Fetal Screen: NEGATIVE
Gestational Age(Wks): 39
Unit division: 0

## 2014-04-08 NOTE — Progress Notes (Signed)
CSW attempted to meet with MOB in her first floor room/117 to introduce myself, complete assessment due to NICU admission and offer support, but she was not in her room at this time.  She was also not at baby's bedside.  CSW will attempt again at a later time.

## 2014-04-08 NOTE — Progress Notes (Signed)
CSW attempted again to meet with MOB to complete assessment due to NICU admission, but MOB was again not in her room.  CSW will attempt again at a later time if MOB is available.

## 2014-04-08 NOTE — Progress Notes (Signed)
Subjective: Postpartum Day 2: Cesarean Delivery Patient reports incisional pain, tolerating PO and no problems voiding.  Cr stable, d/w Dr. Jimmy Footman yesterday, will have f/u post-operatively as outpatient to [facility]  Objective: Vital signs in last 24 hours: Temp:  [98.1 F (36.7 C)] 98.1 F (36.7 C) (09/02 0539) Pulse Rate:  [82-92] 82 (09/02 0539) Resp:  [18-20] 18 (09/02 0539) BP: (97-127)/(60-76) 127/76 mmHg (09/02 0539)  Physical Exam:  General: alert and no distress Lochia: appropriate Uterine Fundus: firm Incision: healing well DVT Evaluation: No evidence of DVT seen on physical exam.   Recent Labs  04/06/14 0300 04/07/14 0600  HGB 9.9* 7.2*  HCT 29.1* 21.4*    Assessment/Plan: Status post Cesarean section. Doing well postoperatively.  Continue current care.  Bovard-Stuckert, Ivan Maskell 04/08/2014, 7:32 AM

## 2014-04-09 ENCOUNTER — Encounter (HOSPITAL_COMMUNITY): Payer: Self-pay | Admitting: Obstetrics and Gynecology

## 2014-04-09 DIAGNOSIS — N289 Disorder of kidney and ureter, unspecified: Secondary | ICD-10-CM

## 2014-04-09 MED ORDER — OXYCODONE-ACETAMINOPHEN 5-325 MG PO TABS: 1.0000 | ORAL_TABLET | Freq: Four times a day (QID) | ORAL | Status: DC | PRN

## 2014-04-09 MED ORDER — OXYMORPHONE HCL ER 5 MG PO TB12: 5.0000 mg | ORAL_TABLET | Freq: Four times a day (QID) | ORAL | Status: DC | PRN

## 2014-04-09 MED ORDER — PRENATAL MULTIVITAMIN CH: 1.0000 | ORAL_TABLET | Freq: Every day | ORAL | Status: DC

## 2014-04-09 NOTE — Discharge Summary (Signed)
Obstetric Discharge Summary Reason for Admission: rupture of membranes Prenatal Procedures: none Intrapartum Procedures: cesarean: low cervical, transverse Postpartum Procedures: none Complications-Operative and Postpartum: elevated Creatinine - will f/u with [facility] - Dr. Jimmy Footman Hemoglobin  Date Value Ref Range Status  04/07/2014 7.2* 12.0 - 15.0 g/dL Final     DELTA CHECK NOTED     REPEATED TO VERIFY     HCT  Date Value Ref Range Status  04/07/2014 21.4* 36.0 - 46.0 % Final    Physical Exam:  General: alert and no distress Lochia: appropriate Uterine Fundus: firm Incision: healing well DVT Evaluation: No evidence of DVT seen on physical exam.  Discharge Diagnoses: Term Pregnancy-delivered  Discharge Information: Date: 04/09/2014 Activity: pelvic rest Diet: routine Medications: PNV, Percocet and Oxycodone Condition: stable Instructions: refer to practice specific booklet Discharge to: home Follow-up Information   Follow up with Janyth Contes, MD On 05/21/2014. (10:00 am)    Specialty:  Obstetrics and Gynecology   Contact information:   [address] [place] [address] [phone]       Follow up with Bovard-Stuckert, Jeral Fruit, MD. Schedule an appointment as soon as possible for a visit in 6 weeks. (for postpartum check)    Specialty:  Obstetrics and Gynecology   Contact information:   [address] [address] [phone]       Follow up with DETERDING,JAMES L, MD. Schedule an appointment as soon as possible for a visit in 2 weeks. (follow-up regarding kidney function.)    Specialty:  Nephrology   Contact information:   [place] [address] [phone]       Newborn Data: Live born female  Birth Weight: 6 lb 8.2 oz (2955 g) APGAR: 8, 9  Home with mother.  Bovard-Stuckert, Gilbert Manolis 04/09/2014, 8:18 AM

## 2014-04-09 NOTE — Progress Notes (Signed)
Clinical Social Work Department BRIEF PSYCHOSOCIAL ASSESSMENT 04/09/2014  Patient:  Betty Ball, Betty Ball     Account Number:  [id number]     Admit date:  04/06/2014  Clinical Social Worker:  Barbarann Ehlers  Date/Time:  04/09/2014 11:00 AM  Referred by:    Date Referred:    Other Referral:   No referral-NICU admission   Interview type:  Patient Other interview type:    PSYCHOSOCIAL DATA Living Status:  FAMILY Admitted from facility:   Level of care:   Primary support name:  Jacquenette Shone Primary support relationship to patient:  SPOUSE Degree of support available:   Good support system    CURRENT CONCERNS Current Concerns  Behavioral Health Issues   Other Concerns:    SOCIAL WORK ASSESSMENT / PLAN CSW met with MOB at baby's bedside, after many attempts to her room to complete assessment, to introduce myself and offer support.  CSW evaluated supports and current emotional state, especially given MOB's discharge today. CSW discussed signs and symptoms of PPD and common emotions related to the NICU experience.  CSW gave contact information and asked MOB to contact CSW if she has any questions, concerns, or needs while her daughter is in the NICU.  CSW has no social concerns at this time.   Assessment/plan status:  Psychosocial Support/Ongoing Assessment of Needs Other assessment/ plan:   Information/referral to community resources:   No referral needs noted    PATIENT'S/FAMILY'S RESPONSE TO PLAN OF CARE: MOB was quiet, but very pleasant and reports she and baby are doing well.  She states she has two sons at home, ages 38 and 38.  She reports that her 38 year old was born 62 weeks early and spent 3 weeks in the NICU.  Her husband is her main support person, and she reports having many other people supporting her and her family as well.  She reports no hx of PPD and no current concerns of depression at this time.  She states no questions for CSW and reports coping well currently.  She is  hopeful that baby will not have to remain in the hospital much longer.  She seemed appreciative of CSW's visit.

## 2014-04-09 NOTE — Progress Notes (Addendum)
Subjective: Postpartum Day 3: Cesarean Delivery Patient reports incisional pain, tolerating PO and no problems voiding. Nl lochia, pain controlled.  Will f/u re: increased Cr at [facility] as outpatient.    Objective: Vital signs in last 24 hours: Temp:  [98 F (36.7 C)-98.3 F (36.8 C)] 98 F (36.7 C) (09/03 9983) Pulse Rate:  [80-93] 93 (09/03 0614) Resp:  [18-20] 20 (09/03 0614) BP: (111-122)/(68-69) 122/69 mmHg (09/03 3825)  Physical Exam:  General: alert and no distress Lochia: appropriate Uterine Fundus: firm Incision: healing well DVT Evaluation: No evidence of DVT seen on physical exam.   Recent Labs  04/07/14 0600  HGB 7.2*  HCT 21.4*    Assessment/Plan: Status post Cesarean section. Doing well postoperatively.  Discharge home with standard precautions and return to clinic in 2 weeks.  D/C percocet and PNV, f/u for incision check and postpartum check, 2 and 6 weeks.  No motrin at discharge. D/C with oxycodone.    Betty Ball Ball 04/09/2014, 7:41 AM

## 2014-06-08 ENCOUNTER — Encounter (HOSPITAL_COMMUNITY): Payer: Self-pay | Admitting: Obstetrics and Gynecology

## 2015-01-22 IMAGING — US US FETAL BPP W/O NONSTRESS
1 series · 13 of 13 positions shown · non-contrast
Comparison: none

[Series 1: us fetal bpp w/o nonstress · non-contrast · 13 acquisitions, 13 frames shown]
[im 1/13]
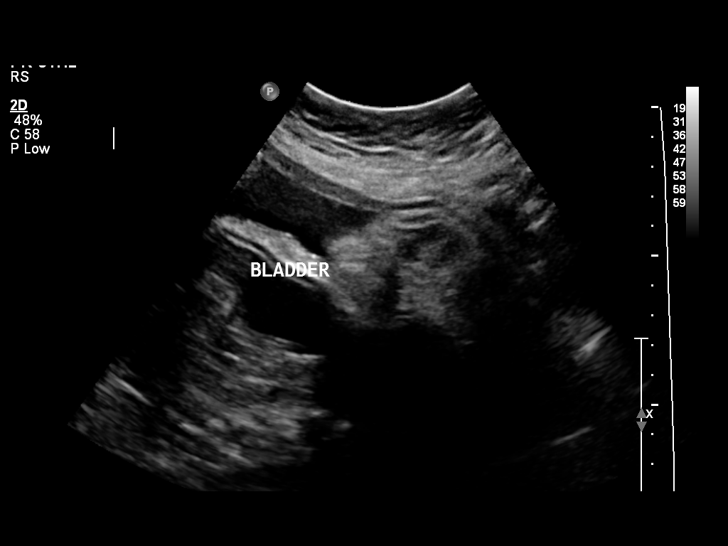
[im 2/13]
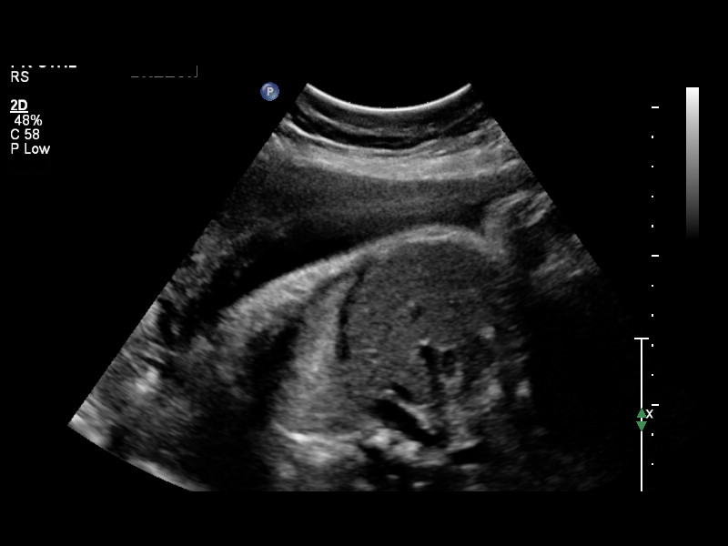
[im 3/13]
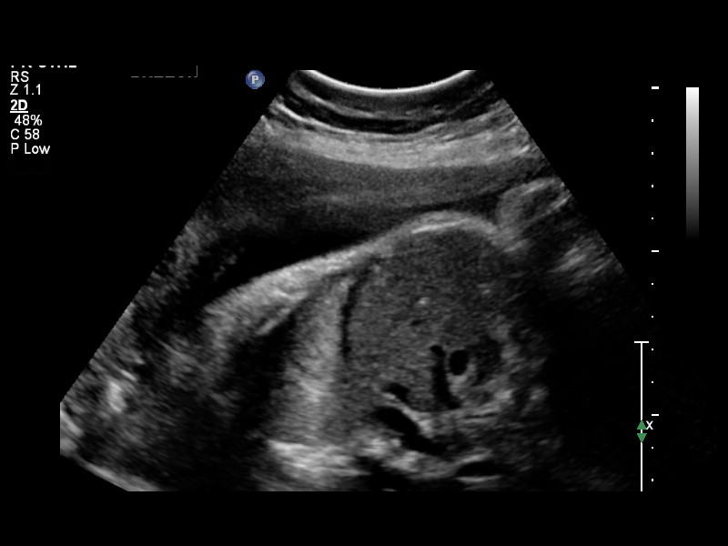
[im 4/13]
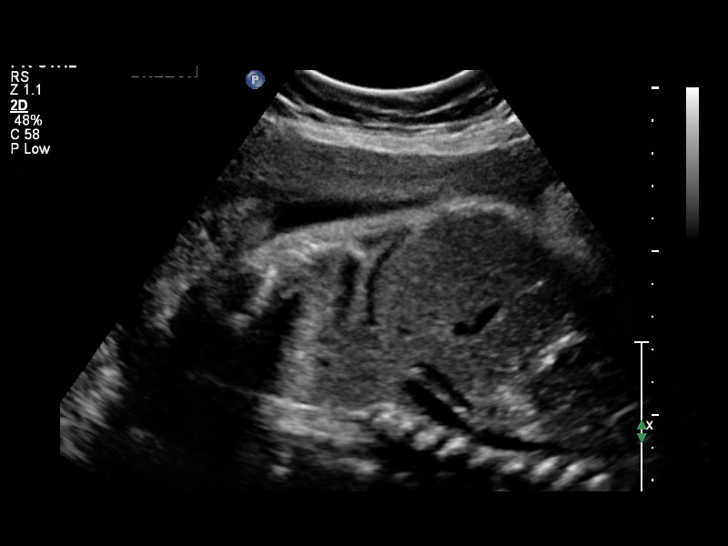
[im 5/13]
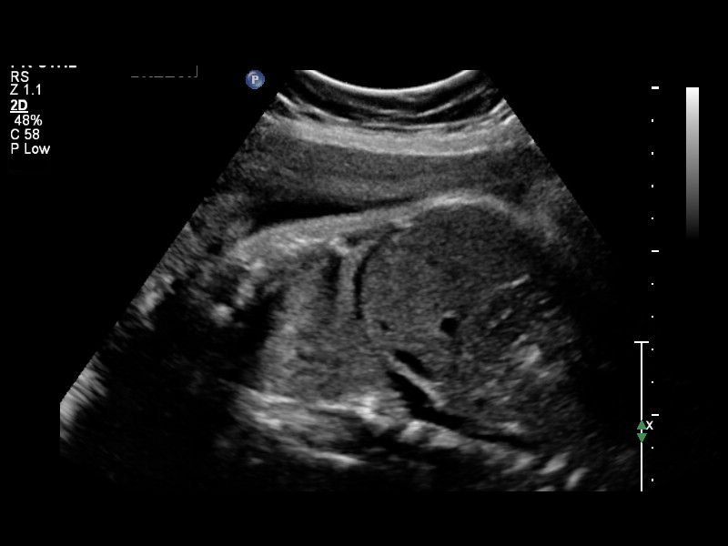
[im 6/13]
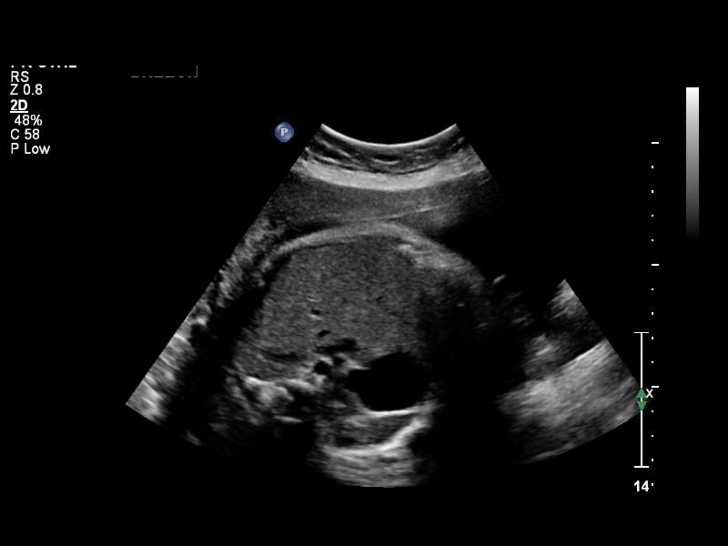
[im 7/13]
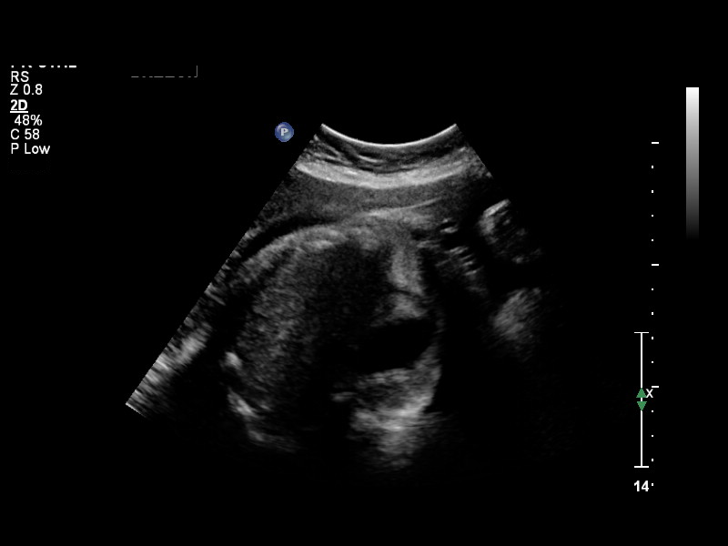
[im 8/13]
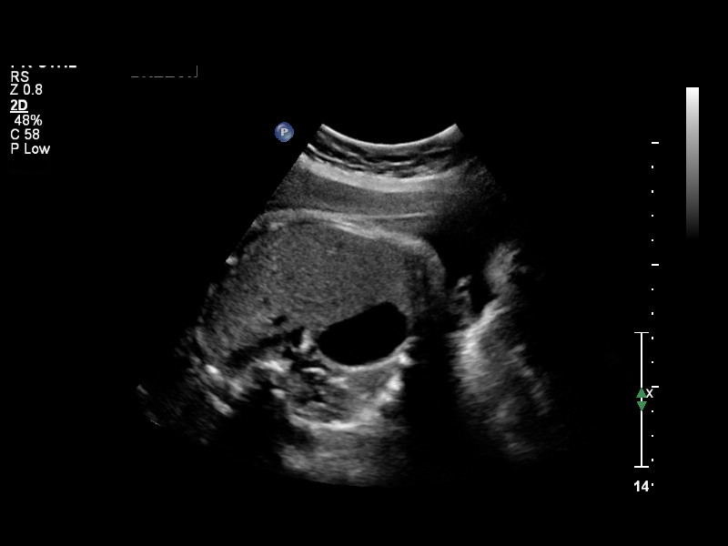
[im 9/13]
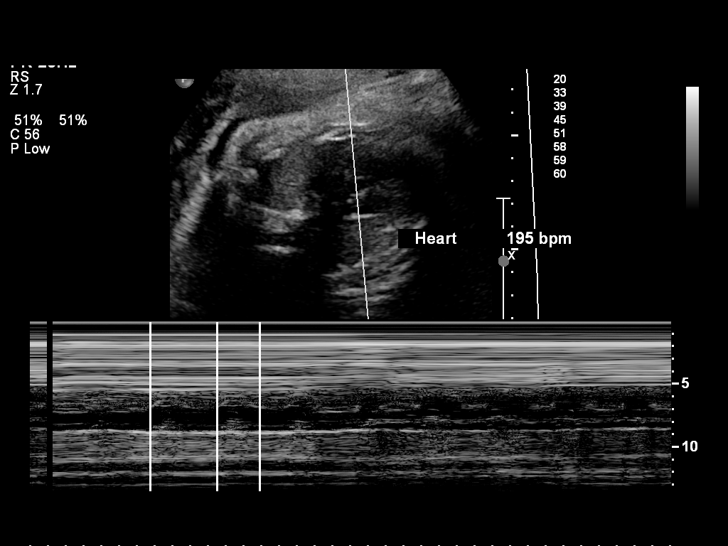
[im 10/13]
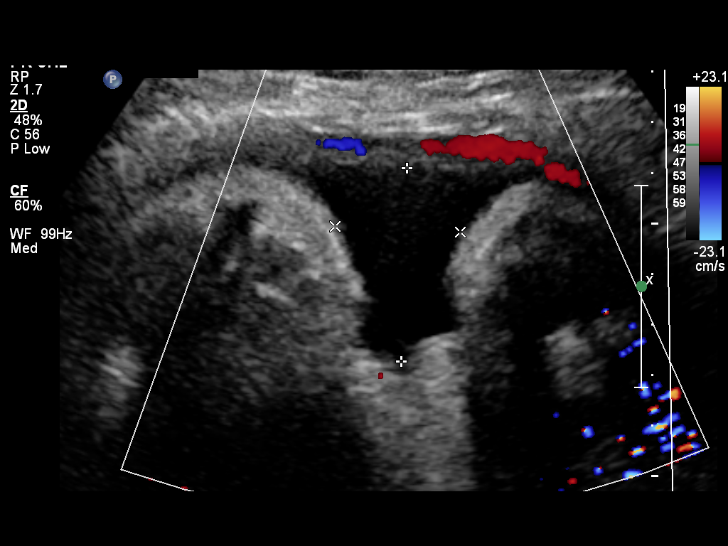
[im 11/13]
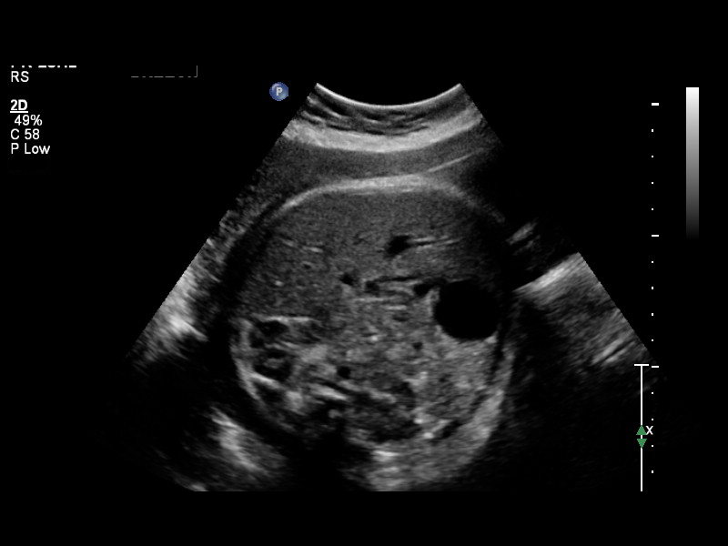
[im 12/13]
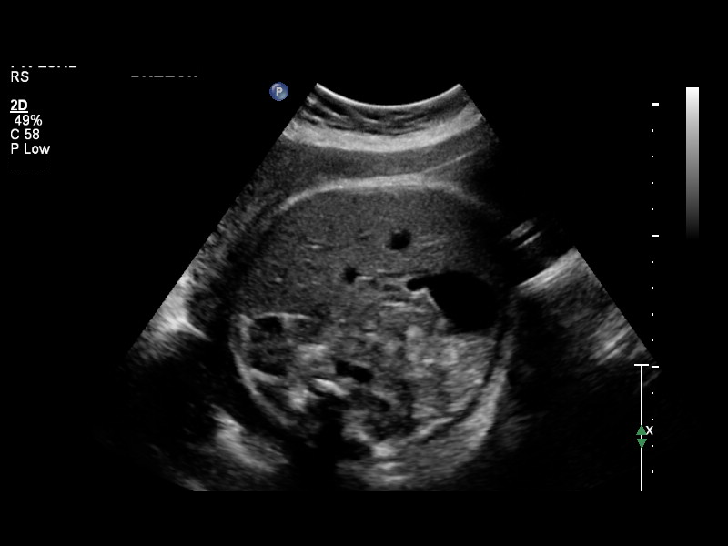
[im 13/13]
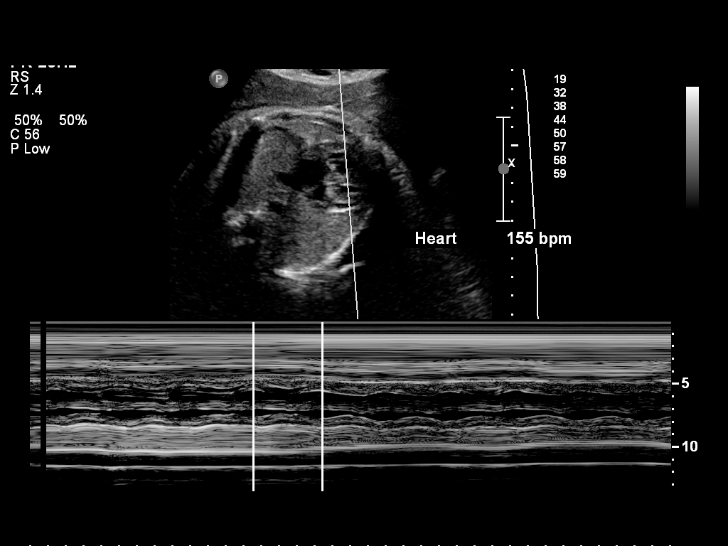

[13 of 13 positions shown; findings below may reference images not displayed]

OBSTETRICS REPORT
                      (Signed Final 03/26/2014 [DATE])

Service(s) Provided

Indications

 Non-reactive NST
Fetal Evaluation

 Num Of Fetuses:    1
 Fetal Heart Rate:  155                          bpm
 Cardiac Activity:  Observed
 Presentation:      Breech

 Amniotic Fluid
 AFI FV:      Subjectively within normal limits
                                             Larg Pckt:    3.81  cm
Biophysical Evaluation

 Amniotic F.V:   Pocket => 2 cm two         F. Tone:        Observed
                 planes
 F. Movement:    Observed                   Score:          [DATE]
 F. Breathing:   Observed
Gestational Age

 Clinical EDD:  40w 5d                                        EDD:   03/21/14
 Best:          37w 3d     Det. By:  Early Ultrasound         EDD:   04/13/14
Impression

 SIUP at 37+3 weeks
 Breech presentation
 Normal amniotic fluid volume
 BPP [DATE]
Recommendations

 Follow-up as clinically indicated

## 2015-02-03 IMAGING — US US RENAL
1 series · 14 of 25 positions shown · non-contrast
Comparison: None.

CLINICAL DATA: Elevated creatinine.

EXAM:
RENAL/URINARY TRACT ULTRASOUND COMPLETE

[Series 1: us renal · 14 of 26 slices shown]
[im 1/26]
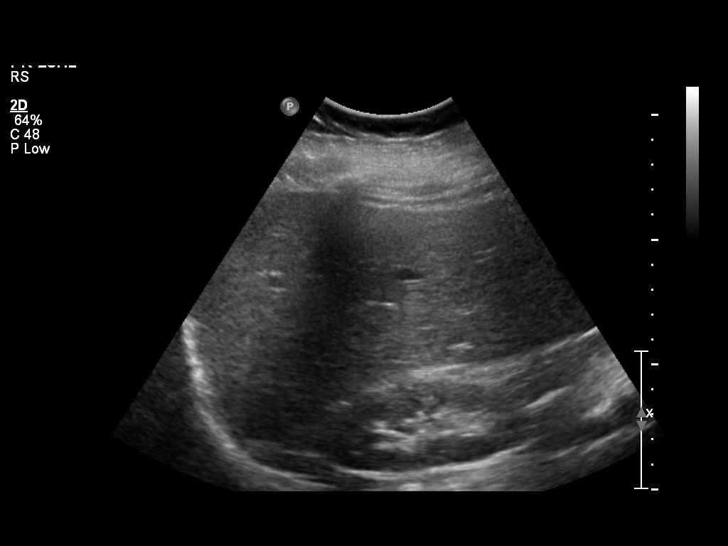
[im 3/26]
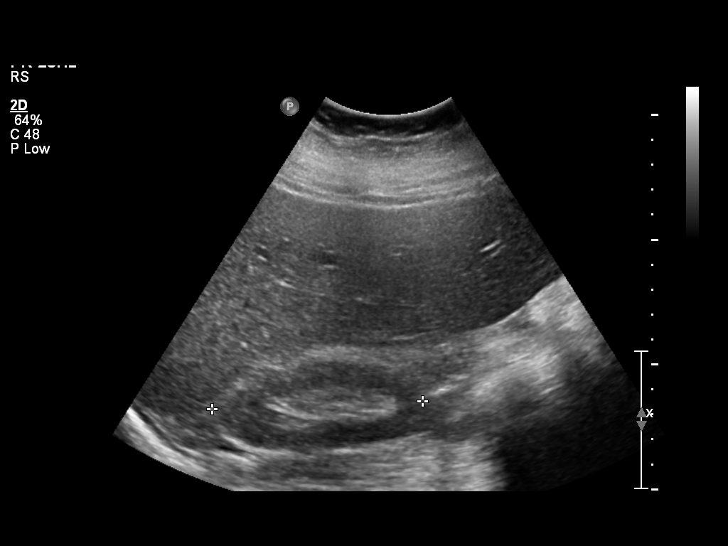
[im 5/26]
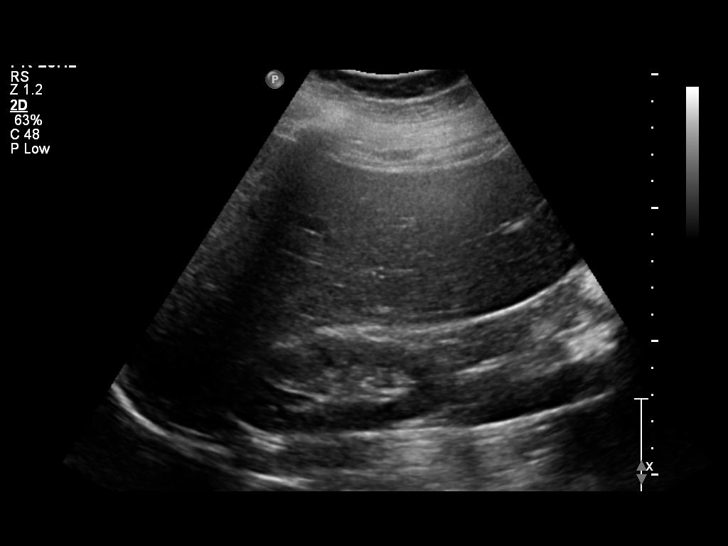
[im 7/26]
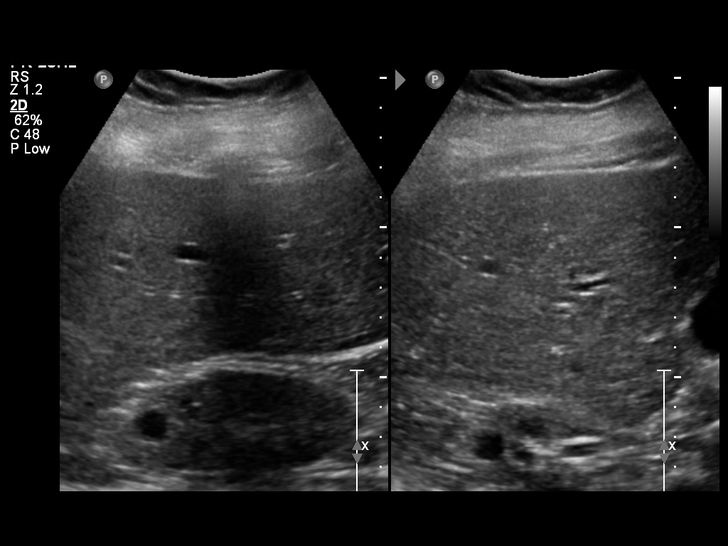
[im 9/26]
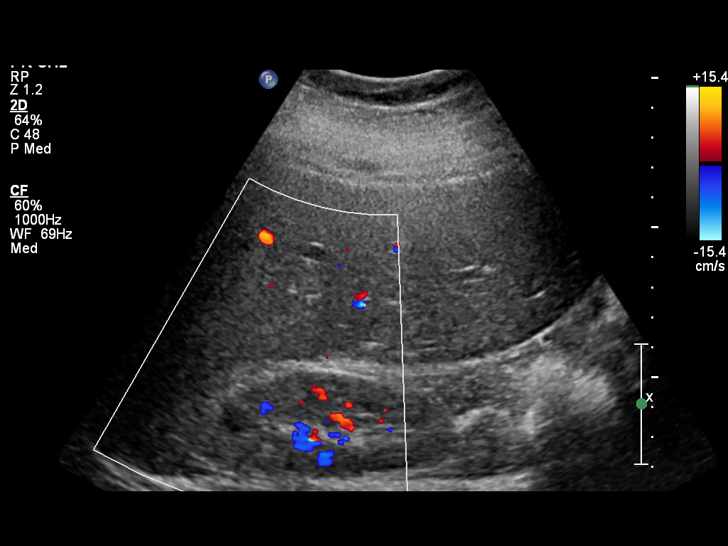
[im 10/26]
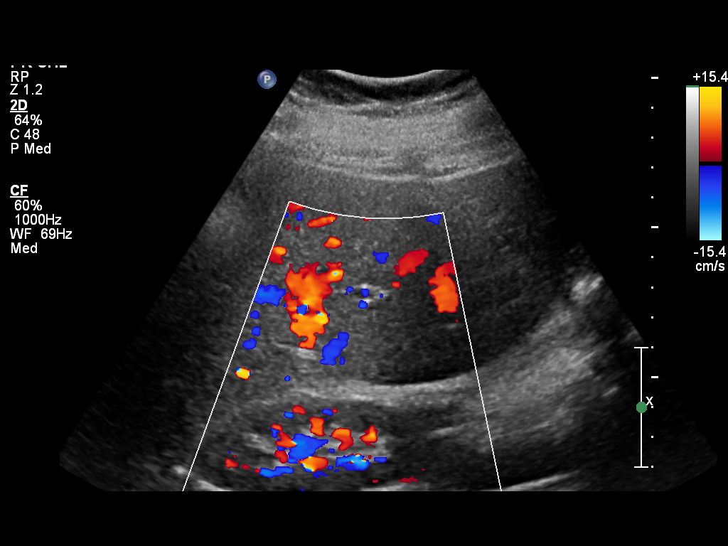
[im 12/26]
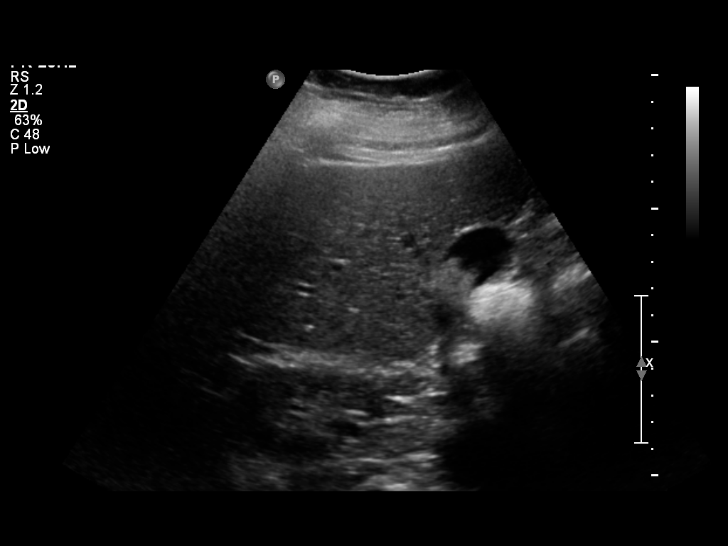
[im 14/26]
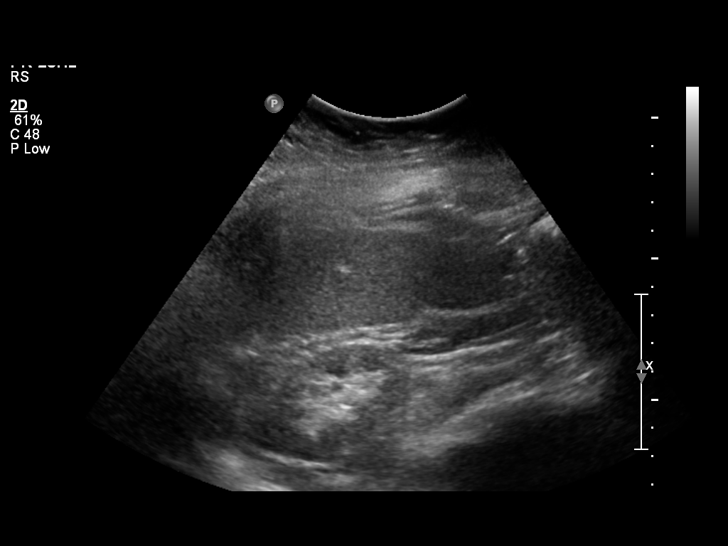
[im 16/26]
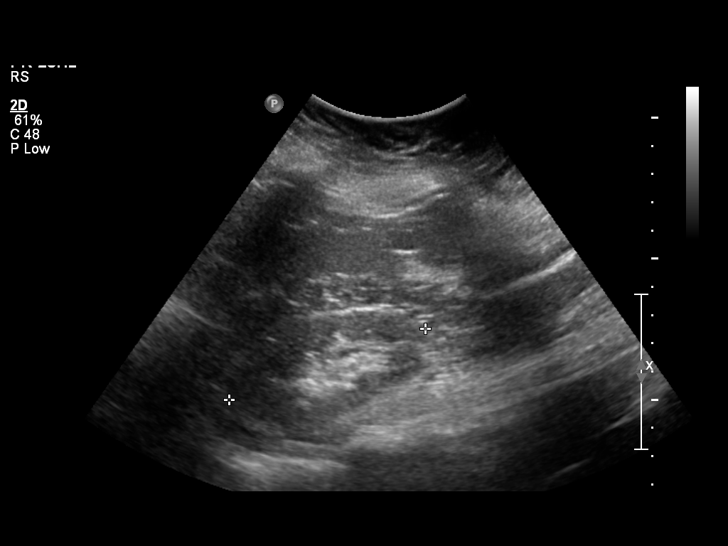
[im 17/26]
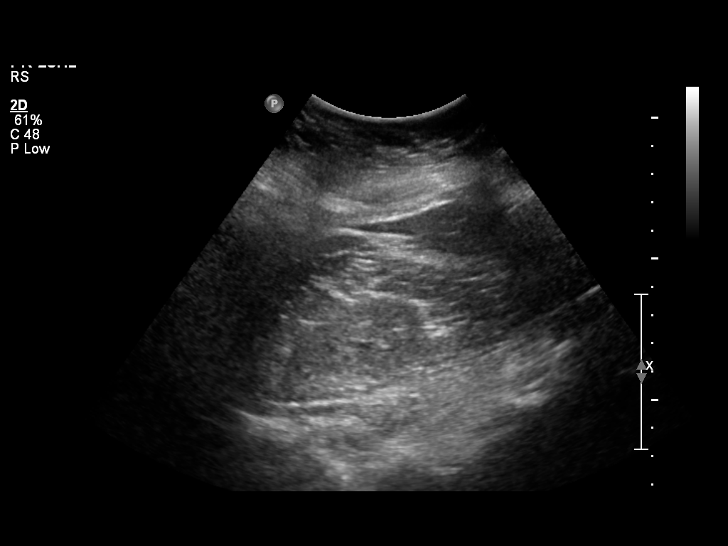
[im 19/26]
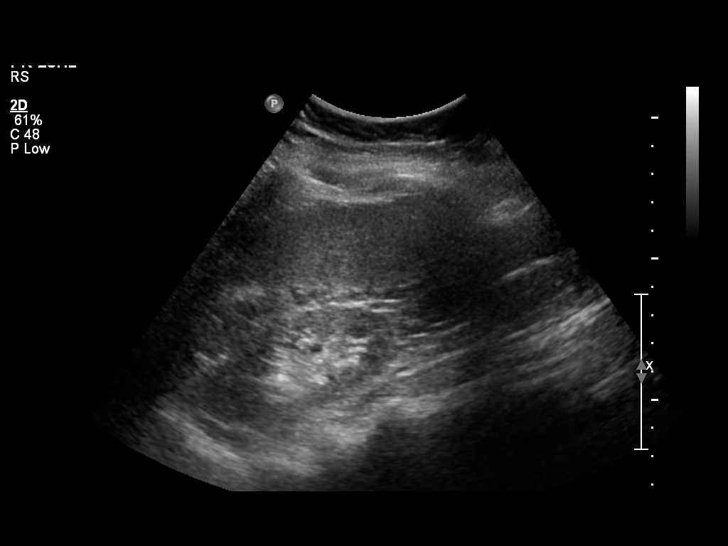
[im 21/26]
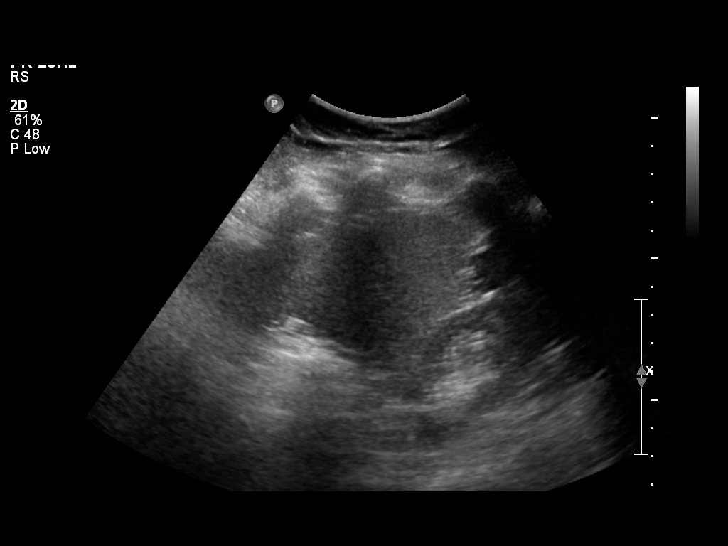
[im 23/26]
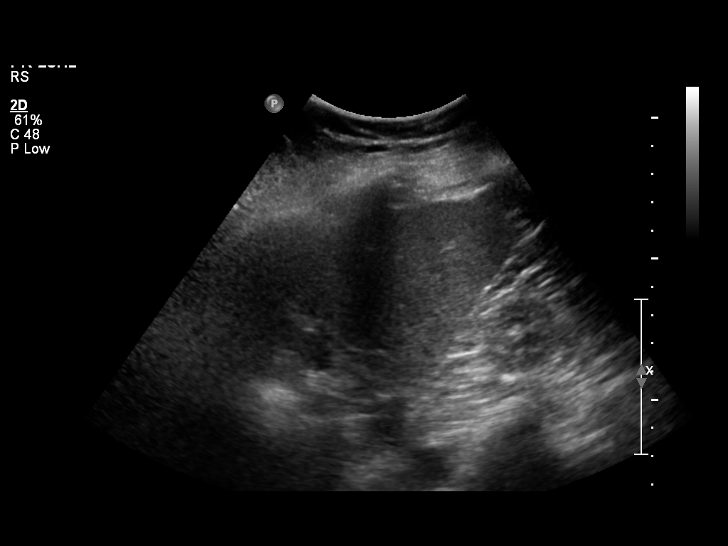
[im 26/26]
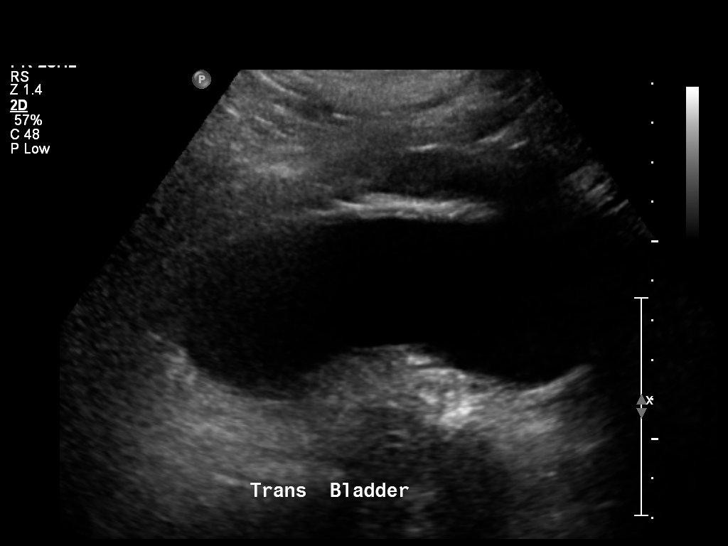

[14 of 25 positions shown; findings below may reference images not displayed]

FINDINGS: Right Kidney:

Length: 8.4 cm. Normal renal cortical thickness and echogenicity. No
hydronephrosis. There is a 1.2 x 1.1 x 0.9 cm cyst within the
superior pole of the right kidney.

Left Kidney:

Length: 7.4 cm. Echogenicity within normal limits. No mass or
hydronephrosis visualized.

Bladder:

Appears normal for degree of bladder distention.
IMPRESSION: No hydronephrosis.

## 2015-04-02 ENCOUNTER — Other Ambulatory Visit: Payer: Self-pay

## 2015-06-11 HISTORY — PX: EXCISION VAGINAL CYST: SHX5825

## 2015-06-11 HISTORY — PX: CYSTOSCOPY/RETROGRADE/URETEROSCOPY: SHX5316

## 2015-10-26 ENCOUNTER — Other Ambulatory Visit: Payer: Self-pay

## 2015-10-26 HISTORY — PX: LAPAROSCOPIC HYSTERECTOMY: SHX1926

## 2015-10-26 HISTORY — PX: BILATERAL OOPHORECTOMY: SHX1221

## 2015-11-08 ENCOUNTER — Encounter: Payer: Self-pay | Admitting: Gynecologic Oncology

## 2015-11-08 ENCOUNTER — Telehealth: Payer: Self-pay

## 2015-11-08 ENCOUNTER — Ambulatory Visit: Payer: BLUE CROSS/BLUE SHIELD | Attending: Gynecologic Oncology | Admitting: Gynecologic Oncology

## 2015-11-08 VITALS — BP 125/76 | HR 79 | Temp 98.0°F | Resp 18 | Ht 62.0 in | Wt 194.6 lb

## 2015-11-08 DIAGNOSIS — E041 Nontoxic single thyroid nodule: Secondary | ICD-10-CM | POA: Diagnosis not present

## 2015-11-08 DIAGNOSIS — N289 Disorder of kidney and ureter, unspecified: Secondary | ICD-10-CM

## 2015-11-08 DIAGNOSIS — E039 Hypothyroidism, unspecified: Secondary | ICD-10-CM | POA: Diagnosis not present

## 2015-11-08 DIAGNOSIS — F329 Major depressive disorder, single episode, unspecified: Secondary | ICD-10-CM | POA: Diagnosis not present

## 2015-11-08 DIAGNOSIS — Q639 Congenital malformation of kidney, unspecified: Secondary | ICD-10-CM | POA: Diagnosis not present

## 2015-11-08 DIAGNOSIS — Z8041 Family history of malignant neoplasm of ovary: Secondary | ICD-10-CM | POA: Insufficient documentation

## 2015-11-08 DIAGNOSIS — Q513 Bicornate uterus: Secondary | ICD-10-CM | POA: Insufficient documentation

## 2015-11-08 DIAGNOSIS — N809 Endometriosis, unspecified: Secondary | ICD-10-CM | POA: Insufficient documentation

## 2015-11-08 DIAGNOSIS — N183 Chronic kidney disease, stage 3 (moderate): Secondary | ICD-10-CM | POA: Diagnosis not present

## 2015-11-08 DIAGNOSIS — N979 Female infertility, unspecified: Secondary | ICD-10-CM | POA: Diagnosis not present

## 2015-11-08 DIAGNOSIS — N920 Excessive and frequent menstruation with regular cycle: Secondary | ICD-10-CM | POA: Insufficient documentation

## 2015-11-08 DIAGNOSIS — K449 Diaphragmatic hernia without obstruction or gangrene: Secondary | ICD-10-CM | POA: Insufficient documentation

## 2015-11-08 DIAGNOSIS — L719 Rosacea, unspecified: Secondary | ICD-10-CM | POA: Diagnosis not present

## 2015-11-08 DIAGNOSIS — D3911 Neoplasm of uncertain behavior of right ovary: Secondary | ICD-10-CM | POA: Diagnosis present

## 2015-11-08 DIAGNOSIS — E0500 Thyrotoxicosis with diffuse goiter without thyrotoxic crisis or storm: Secondary | ICD-10-CM | POA: Diagnosis not present

## 2015-11-08 NOTE — Patient Instructions (Signed)
Plan to have a CT scan of the abdomen and pelvis without contrast.  Plan to follow up with Dr. Elgie Congo in six months and Dr. Denman George in one year.  Please call [phone] our office after you see Dr. Elgie Congo to schedule your appointment with Dr. Denman George for April 2018.

## 2015-11-08 NOTE — Telephone Encounter (Signed)
Patient contacted and updated with new time of scheduled CT scan on April; 06, 2017 , time changed from 08:30 AM to 1:30 PM per patient's request. Patient states understanding , denies further questions at this time. Instructions given to arrive 15 min prior to appointment and NPO 6 hours prior to the procedure , understanding stated.

## 2015-11-08 NOTE — Progress Notes (Signed)
Consult Note: Gyn-Onc  Consult was requested by Dr. Lynnda Shields for the evaluation of Betty Ball 40 y.o. female  CC:  Chief Complaint  Patient presents with  . right ovarian borderline tumor    Assessment/Plan:  Betty Ball  is a 40 y.o.  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 I recommend CT imaging of the abdomen and pelvis to rule out gross residual disease. If negative, no further adjuvant therapy is indicated for this pathology.  I discussed with the patient the low but possible risk for recurrence. I discussed that recurrences typically occur in the peritoneal cavity. I would estimate her 10 year risk for recurrence to be approximately 5%.  I recommend 6 monthly surveillance visits with symptom assessment, physical examination, pelvic examination and CA 125 assessment.  If suspicious findings are noted on examination or a persistent elevation in CA 125, I would recommend confirmatory imaging at that time.  I discussed typical symptoms of recurrence (typically peritoneal symptoms) such as bloating, abdominal or pelvic pain, altered GI function. She was informed to notify us of these should they develop in between her visits.  She will follow-up with Dr Elgie Congo in 6 months and with me in 12 months.   HPI: Betty Ball is a 40 year old [id number] who is seen in consultation at the request of Dr Elgie Congo for right ovarian serous LMP.  The patient underwent total laparoscopic hysterectomy, BSO on 10/26/15 for endometriosis and menorrhagia. Intraoperative findings included "small foci of endometriosis, minimal scarring from prior cesarean section and a slightly broad uterus".  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 Washings were not sampled, though the specimen was in tact. Pathology revealed a "serous borderline tumor/atypical proliferative serous tumor".   Due to no  suspicion for malignancy pre-op, a pre-op CA 125 was not drawn.  Her family history is significant for extended family members (cousin and aunt - not related) with breast cancer. She has a maternal great grandmother with a history of ovarian cancer (possibly - "ovarian-like cancer". She had primary infertility associated with endometriosis but did not take ovarian stimulatory meds/fertility meds. She has a long standing Hx of depo provera, OCP use for endometriosis. She has a personal hx of PCOS.  Her medical history is significant for stage III CKD likely secondary to stones and infections. She has Graves disease and a thyroid nodule being monitored.  Current Meds:  Outpatient Encounter Prescriptions as of 11/08/2015  Medication Sig  . calcium carbonate (TUMS - DOSED IN MG ELEMENTAL CALCIUM) 500 MG chewable tablet Chew 1 tablet by mouth daily as needed for indigestion or heartburn.   . ferrous sulfate 325 (65 FE) MG tablet Take 325 mg by mouth every other day.  . levothyroxine (SYNTHROID, LEVOTHROID) 25 MCG tablet Take 25 mcg by mouth daily before breakfast.  . loratadine (CLARITIN) 10 MG tablet Take 10 mg by mouth daily.  Marland Kitchen oxyCODONE-acetaminophen (PERCOCET/ROXICET) 5-325 MG per tablet Take 1-2 tablets by mouth every 6 (six) hours as needed for severe pain (moderate - severe pain).  Marland Kitchen oxymorphone (OPANA ER) 5 MG 12 hr tablet Take 1 tablet (5 mg total) by mouth every 6 (six) hours as needed for pain.  . Prenatal Vit-Fe Fumarate-FA (PRENATAL MULTIVITAMIN) TABS tablet Take 1 tablet by mouth daily at 12 noon.  . ranitidine (ZANTAC) 150 MG tablet Take 150 mg by mouth 2 (two) times daily.  No facility-administered encounter medications on file as of 11/08/2015.    Allergy:  Allergies  Allergen Reactions  . Codeine Nausea Only  . Iodine Swelling  . Other Other (See Comments)    shellfish  . Keflex [Cephalexin] Rash    Social Hx:   Social History   Social History  . Marital Status: Married     Spouse Name: N/A  . Number of Children: N/A  . Years of Education: N/A   Occupational History  . Not on file.   Social History Main Topics  . Smoking status: Never Smoker   . Smokeless tobacco: Never Used  . Alcohol Use: No  . Drug Use: No  . Sexual Activity: Yes    Birth Control/ Protection: Surgical, None   Other Topics Concern  . Not on file   Social History Narrative    Past Surgical Hx:  Past Surgical History  Procedure Laterality Date  . Cesarean section  2001, 2009  . Kidney surgery    . Laprascopic    . Wisdom tooth extraction    . Cesarean section N/A 04/06/2014    Procedure: CESAREAN SECTION;  Surgeon: Janyth Contes, MD;  Location: [facility];  Service: Obstetrics;  Laterality: N/A;  . Bilateral salpingectomy N/A 04/06/2014    Procedure: BILATERAL SALPINGECTOMY;  Surgeon: Janyth Contes, MD;  Location: [facility];  Service: Obstetrics;  Laterality: N/A;  . Abdominal hysterectomy      Past Medical Hx:  Past Medical History  Diagnosis Date  . Bicornate uterus   . 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 Hypothyroidism   . Rosacea   . Migraines   . Seasonal allergies   . Bursitis   . Depression   . Hiatal hernia   . TMJ (dislocation of temporomandibular joint)   . Endometriosis   . Polycystic ovarian disease   . Gestational diabetes   . S/P cesarean section 04/06/2014  . S/P tubal ligation 04/06/2014  . Kidney disease 04/09/2014    Past Gynecological History:  Infertility, endometriosis, c/s x3  No LMP recorded.  Family Hx: History reviewed. No pertinent family history.  Review of Systems:  Constitutional  Feels well,    ENT Normal appearing ears and nares bilaterally Skin/Breast  No rash, sores, jaundice, itching, dryness Cardiovascular  No chest pain, shortness of breath, or edema  Pulmonary  No cough or wheeze.  Gastro Intestinal  No nausea, vomitting, or diarrhoea. No bright red blood per rectum, no abdominal pain, change  in bowel movement, or constipation.  Genito Urinary  No frequency, urgency, dysuria, see HPI Musculo Skeletal  No myalgia, arthralgia, joint swelling or pain  Neurologic  No weakness, numbness, change in gait,  Psychology  No depression, anxiety, insomnia.   Vitals:  Blood pressure 125/76, pulse 79, temperature 98 F (36.7 C), temperature source Oral, resp. rate 18, height 5\' 2"  (1.575 m), weight 194 lb 9.6 oz (88.27 kg), SpO2 94 %, unknown if currently breastfeeding.  Physical Exam: WD in NAD Neck  Supple NROM, without any enlargements.  Lymph Node Survey No cervical supraclavicular or inguinal adenopathy Cardiovascular  Pulse normal rate, regularity and rhythm. S1 and S2 normal.  Lungs  Clear to auscultation bilateraly, without wheezes/crackles/rhonchi. Good air movement.  Skin  No rash/lesions/breakdown  Psychiatry  Alert and oriented to person, place, and time  Abdomen  Normoactive bowel sounds, abdomen soft, non-tender and obese without evidence of hernia. Incisions healing well Back No CVA tenderness Genito Urinary  Vulva/vagina: deferred (postop)  Rectal  Deferred (postop) Extremities  No bilateral cyanosis, clubbing or edema.   Donaciano Eva, MD  11/08/2015, 9:01 AM

## 2015-11-11 ENCOUNTER — Ambulatory Visit (HOSPITAL_COMMUNITY): Payer: BLUE CROSS/BLUE SHIELD

## 2015-11-11 ENCOUNTER — Ambulatory Visit (HOSPITAL_COMMUNITY)
Admission: RE | Admit: 2015-11-11 | Discharge: 2015-11-11 | Disposition: A | Discharge status: Home / Self Care | Payer: BLUE CROSS/BLUE SHIELD | Source: Ambulatory Visit | Attending: Gynecologic Oncology | Admitting: Gynecologic Oncology

## 2015-11-11 DIAGNOSIS — Z90710 Acquired absence of both cervix and uterus: Secondary | ICD-10-CM | POA: Insufficient documentation

## 2015-11-11 DIAGNOSIS — D3911 Neoplasm of uncertain behavior of right ovary: Secondary | ICD-10-CM | POA: Diagnosis present

## 2015-11-11 DIAGNOSIS — N289 Disorder of kidney and ureter, unspecified: Secondary | ICD-10-CM | POA: Diagnosis present

## 2015-11-11 DIAGNOSIS — N261 Atrophy of kidney (terminal): Secondary | ICD-10-CM | POA: Insufficient documentation

## 2015-11-26 ENCOUNTER — Other Ambulatory Visit: Payer: Self-pay | Admitting: Gynecologic Oncology

## 2015-11-26 ENCOUNTER — Telehealth: Payer: Self-pay

## 2015-11-26 DIAGNOSIS — D3911 Neoplasm of uncertain behavior of right ovary: Secondary | ICD-10-CM

## 2015-11-26 NOTE — Telephone Encounter (Signed)
Orders received from [facility] to contact the patient to update with a referral was placed for Genetic Counseling for Lynch Syndrome and she would be contacted with an appointment. Patient contacted and updated with referral being placed and that she would contacted with na appointment. Patient states understanding , denies further questions at this time.

## 2015-11-29 ENCOUNTER — Other Ambulatory Visit: Payer: Self-pay

## 2015-12-02 ENCOUNTER — Other Ambulatory Visit: Payer: Self-pay

## 2015-12-06 ENCOUNTER — Telehealth: Payer: Self-pay | Admitting: Genetic Counselor

## 2015-12-06 DIAGNOSIS — Z853 Personal history of malignant neoplasm of breast: Secondary | ICD-10-CM

## 2015-12-06 HISTORY — PX: BILATERAL TOTAL MASTECTOMY WITH AXILLARY LYMPH NODE DISSECTION: SHX6364

## 2015-12-06 HISTORY — DX: Personal history of malignant neoplasm of breast: Z85.3

## 2015-12-06 NOTE — Telephone Encounter (Signed)
Pt confirmed genetic counseling appt for 5/4 at 2pm, pt completed intake

## 2015-12-09 ENCOUNTER — Ambulatory Visit (HOSPITAL_BASED_OUTPATIENT_CLINIC_OR_DEPARTMENT_OTHER): Payer: BLUE CROSS/BLUE SHIELD | Admitting: Genetic Counselor

## 2015-12-09 ENCOUNTER — Other Ambulatory Visit: Payer: BLUE CROSS/BLUE SHIELD

## 2015-12-09 DIAGNOSIS — Z808 Family history of malignant neoplasm of other organs or systems: Secondary | ICD-10-CM

## 2015-12-09 DIAGNOSIS — C50911 Malignant neoplasm of unspecified site of right female breast: Secondary | ICD-10-CM

## 2015-12-09 DIAGNOSIS — Z800 Family history of malignant neoplasm of digestive organs: Secondary | ICD-10-CM

## 2015-12-09 DIAGNOSIS — Z315 Encounter for genetic counseling: Secondary | ICD-10-CM

## 2015-12-09 DIAGNOSIS — Z803 Family history of malignant neoplasm of breast: Secondary | ICD-10-CM | POA: Diagnosis not present

## 2015-12-09 DIAGNOSIS — Z801 Family history of malignant neoplasm of trachea, bronchus and lung: Secondary | ICD-10-CM

## 2015-12-09 DIAGNOSIS — Z8041 Family history of malignant neoplasm of ovary: Secondary | ICD-10-CM

## 2015-12-09 DIAGNOSIS — D3911 Neoplasm of uncertain behavior of right ovary: Secondary | ICD-10-CM

## 2015-12-10 ENCOUNTER — Encounter: Payer: Self-pay | Admitting: Genetic Counselor

## 2015-12-10 ENCOUNTER — Telehealth: Payer: Self-pay | Admitting: *Deleted

## 2015-12-10 DIAGNOSIS — Z803 Family history of malignant neoplasm of breast: Secondary | ICD-10-CM | POA: Insufficient documentation

## 2015-12-10 DIAGNOSIS — C50919 Malignant neoplasm of unspecified site of unspecified female breast: Secondary | ICD-10-CM | POA: Insufficient documentation

## 2015-12-10 NOTE — Telephone Encounter (Signed)
Pt called questioning the process of our program. Relate she was recently dx with breast cancer at [facility]. Discussed our process of [facility] and surgeon referral 1st process. Pt relate she is scheduled to see a surgeon in [place] on Monday. Informed pt that after her visit if she would like to discuss her treatment in [place], to please call and we will get her scheduled. Received verbal understanding. Denies further needs at this time. Contact information provided.

## 2015-12-10 NOTE — Progress Notes (Signed)
REFERRING PROVIDER: CROSS, MELISSA, NP  OTHER PROVIDER(S): ROSSI, EMMA, MD  PRIMARY PROVIDER:  [facility]  PRIMARY REASON FOR VISIT:  1. Right ovarian tumor of borderline malignancy   2. Malignant neoplasm of right female breast, unspecified site of breast ([place])   3. Family history of breast cancer in female   32. Family history of ovarian cancer   5. Family history of colon cancer   6. Family history of melanoma   7. Family history of lung cancer      HISTORY OF PRESENT ILLNESS:   Betty Ball, a 40 y.o. female, was seen for a [facility] cancer genetics consultation at the request of Betty John, NP due to a personal history of right ovarian tumor of borderline malignancy and family history of breast, colon, ovarian, and other cancers.  Following this referral, Betty Ball was also diagnosed with breast cancer.   Betty Ball presents to clinic today with her husband to discuss the possibility of a hereditary predisposition to cancer, genetic testing, and to further clarify her future cancer risks, as well as potential cancer risks for family members.   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 This was treated with TAH-BSO.  In April 2017 at the age of 61, Betty Ball was diagnosed with invasive ductal carcinoma of the right breast.  Hormone receptors are pending.  Genetic testing will help inform treatment and surgical decisions.  HORMONAL RISK FACTORS:  Menarche was at age 53.  First live birth at age 40.  OCP use for approximately 20+ years.  Ovaries intact: no.  Hysterectomy: yes.  Menopausal status: postmenopausal.  HRT use: 1 years. Colonoscopy: yes, at age 20 due to blood in stool; normal. Mammogram within the last year: this was her first mammogram. Number of breast biopsies: 1. Up to date with pelvic exams:  yes. Any excessive radiation exposure in the past:   History of x-rays and CTs, but not excessive history  Past Medical History  Diagnosis Date  . Bicornate uterus   . 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 Hypothyroidism   . Rosacea   . Migraines   . Seasonal allergies   . Bursitis   . Depression   . Hiatal hernia   . TMJ (dislocation of temporomandibular joint)   . Endometriosis   . Polycystic ovarian disease   . Gestational diabetes   . S/P cesarean section 04/06/2014  . S/P tubal ligation 04/06/2014  . Kidney disease 04/09/2014  . Breast cancer ([place]) 12/10/2015    Past Surgical History  Procedure Laterality Date  . Cesarean section  2001, 2009  . Kidney surgery    . Laprascopic    . Wisdom tooth extraction    . Cesarean section N/A 04/06/2014    Procedure: CESAREAN SECTION;  Surgeon: Janyth Contes, MD;  Location: [facility];  Service: Obstetrics;  Laterality: N/A;  . Bilateral salpingectomy N/A 04/06/2014    Procedure: BILATERAL SALPINGECTOMY;  Surgeon: Janyth Contes, MD;  Location: [facility];  Service: Obstetrics;  Laterality: N/A;  . Abdominal hysterectomy      Social History   Social History  . Marital Status: Married    Spouse Name: N/A  . Number of Children: N/A  . Years of Education: N/A   Social History Main Topics  . Smoking status: Former Smoker -- 0.50 packs/day for 10 years  Quit date: 11/08/1998  . Smokeless tobacco: Never Used     Comment: at most 1ppd, but went to 1ppw before quitting  . Alcohol Use: No     Comment: in her 21s  . Drug Use: No  . Sexual Activity: Yes    Birth Control/ Protection: Surgical, None   Other Topics Concern  . None   Social History Narrative     FAMILY HISTORY:  We obtained a detailed, 4-generation family history.  Significant diagnoses are listed below: Family History  Problem Relation Age of Onset  . Other Mother     hx pre-cancerous skin findings; hysterectomy in 40s-50s   . Kidney disease Son     one son has kidney disease  . Colon polyps  Maternal Aunt     (x2) maternal aunts have had multiple colonoscopies to remove multiple adenomatous colon polyps  . Alzheimer's disease Maternal Grandmother   . Heart attack Maternal Grandfather     d. 25s  . Melanoma Paternal Grandfather     stage IV; d. 31s  . Breast cancer Cousin 82    maternal 1st cousin; genetic testing reportedly negative in 2015  . Colon cancer Other     maternal great grandmother (MGM's mother) dx. at unspecified age  . Ovarian cancer Other     maternal great grandmother (MGM's mother) dx. at unspecified age  . Colon cancer Other     maternal great aunt (MGF's sister) dx. at unspecified age  . Breast cancer Other     maternal great aunt (MGF's sister) dx. at unspecified age  . Breast cancer Cousin     (x2) maternal 1st cousins, once-removed (MGF's side) dx. at unspecified ages  . Lung cancer Other     paternal great grandfather (PGM's father) dx. at unspecified age    Betty Ball has two sons, ages 59 and 6, and one daughter who is 20 months old.  She has one full brother who is currently 80 and has not had cancer.  Her mother is currently 30 and has a history of pre-cancerous skin findings, but who has not had a cancer diagnosis.  She also has a history of a hysterectomy for an unspecified cause in her 40s-50s.  Her father is currently 57 and has not had cancer.  Betty Ball mother has three full sisters, all of whom are in their late 54s-early 70s and have not had cancer.  Two aunts have had multiple adenomatous colon polyps removed.  One maternal first cousin was diagnosed with breast cancer at the age of 24.  She reportedly had genetic testing that was negative 2 years ago.  Betty Ball maternal grandmother died of alzheimer's-related illness in her 55s.  Her maternal grandmother's mother had a history of colon and ovarian cancers at unspecified ages.  Ms. Butzin also reports a more distant cousin on this side of the family with leukemia.  Her maternal grandfather  died of a heart attack in his 61s.  He had one sister who had colon cancer and another sister who had breast cancer.  Ms. Toomey also reports two "2nd cousins" on this side of the family with breast cancer, who were likely maternal first cousins, once-removed.    Betty Ball father has two full sisters, both of whom are currently 58 and early 49s and have not had cancer.  Ms. Lim reports no history of cancer for her paternal first cousins.  Her paternal grandmother is currently 39-84 and has not had cancer.  Her paternal grandfather died of stage IV melanoma in his 76s.  She reports that her paternal grandmother's father had lung cancer and that he had two brothers who had lung cancer and stomach cancer.  She has no further information for other paternal relatives.    Patient's maternal ancestors are of [place] and [place] descent, and paternal ancestors are of [place] and [place] descent. There is no reported Ashkenazi Jewish ancestry. Ms. Fairbairn reports that her parents are "sixth cousins".  GENETIC COUNSELING ASSESSMENT: Betty Ball is a 40 y.o. female with a personal and family history of cancer which is somewhat suggestive of a hereditary breast/ovarian cancer syndrome and predisposition to cancer. We, therefore, discussed and recommended the following at today's visit.   DISCUSSION: We reviewed the characteristics, features and inheritance patterns of hereditary cancer syndromes, particularly those caused by mutations within the BRCA1/2 and Lynch syndrome genes. We also discussed genetic testing, including the appropriate family members to test, the process of testing, insurance coverage and turn-around-time for results. We discussed the implications of a negative, positive and/or variant of uncertain significant result. We recommended Betty Ball pursue genetic testing for the 32-gene Comprehensive Cancer Panel with MSH2 Exons 1-7 Inversion Analysis through [facility].  The Comprehensive  Cancer Panel offered by [facility] includes sequencing and/or deletion duplication testing of the following 32 genes: APC, ATM, AXIN2, BARD1, BMPR1A, BRCA1, BRCA2, BRIP1, CDH1, CDK4, CDKN2A, CHEK2, EPCAM, FANCC, MLH1, MSH2, MSH6, MUTYH, NBN, PALB2, PMS2, POLD1, POLE, PTEN, RAD51C, RAD51D, SCG5/GREM1, SMAD4, STK11, TP53, VHL, and XRCC2.    Based on Betty Ball's personal and family history of cancer, she meets medical criteria for genetic testing. Despite that she meets criteria, she may still have an out of pocket cost. We discussed that if her out of pocket cost for testing is over $100, the laboratory will call and confirm whether she wants to proceed with testing.  If the out of pocket cost of testing is less than $100 she will be billed by the genetic testing laboratory.   PLAN: After considering the risks, benefits, and limitations, Betty Ball  provided informed consent to pursue genetic testing and the blood sample was sent to [facility] for analysis of the 32-gene Comprehensive Cancer Panel with MSH2 Exons 1-7 Inversion Analysis. Results should be available within approximately 2 weeks' time, at which point they will be disclosed by telephone to Betty Ball, as will any additional recommendations warranted by these results. Betty Ball will receive a summary of her genetic counseling visit and a copy of her results once available. This information will also be available in Epic. We encouraged Betty Ball to remain in contact with cancer genetics annually so that we can continuously update the family history and inform her of any changes in cancer genetics and testing that may be of benefit for her family. Betty Ball questions were answered to her satisfaction today. Our contact information was provided should additional questions or concerns arise.  Thank you for the referral and allowing [place] to share in the care of your patient.   Betty Luz, MS, [facility] Certified Genetic  Counselor [email or web address] Phone: [phone]  The patient was seen for a total of 60 minutes in face-to-face genetic counseling.  This patient was discussed with Drs. Magrinat, Lindi Adie and/or Burr Medico who agrees with the above.    _______________________________________________________________________ For Office Staff:  Number of people involved in session: 2 Was an Intern/ student involved with case: no

## 2015-12-27 ENCOUNTER — Telehealth: Payer: Self-pay | Admitting: Genetic Counselor

## 2015-12-27 ENCOUNTER — Ambulatory Visit: Payer: Self-pay | Admitting: Genetic Counselor

## 2015-12-27 DIAGNOSIS — Z809 Family history of malignant neoplasm, unspecified: Secondary | ICD-10-CM

## 2015-12-27 DIAGNOSIS — Z803 Family history of malignant neoplasm of breast: Secondary | ICD-10-CM

## 2015-12-27 DIAGNOSIS — Z8371 Family history of colonic polyps: Secondary | ICD-10-CM

## 2015-12-27 DIAGNOSIS — C50911 Malignant neoplasm of unspecified site of right female breast: Secondary | ICD-10-CM

## 2015-12-27 DIAGNOSIS — D3911 Neoplasm of uncertain behavior of right ovary: Secondary | ICD-10-CM

## 2015-12-27 DIAGNOSIS — Z1379 Encounter for other screening for genetic and chromosomal anomalies: Secondary | ICD-10-CM

## 2015-12-27 NOTE — Telephone Encounter (Signed)
Discussed with Betty Ball that her genetic test results were negative for pathogenic mutations within any of 32 genes on the Comprehensive Cancer Panel (through [facility]) that would cause her to be at an increased genetic risk for breast, ovarian, colon, or other related cancers.  Additionally, no uncertain changes were found.  Discussed that this result cannot totally rule out a genetic cause for her personal and family history of cancer, since we cannot be certain that our testing is the best that it can be at this point in time.  However, there are some reassuring aspects of her family history as well.  For instance, most of the family history of cancer is found in more distant relatives--her mother, aunts, and grandmothers have never had breast or ovarian cancers.  Encouraged her to call and update the family history with [place] in the future and to check back about future testing updates.  Discussed that she should continue to follow her doctors recommendations for future cancer screening.  This negative result does not tell us that she should consider any particular surgical/treatment decisions.  Discussed that the women in her close family are at an increased risk for breast/ovarian cancer, based on the history in the family.  Her daughter and niece will likely be eligible for earlier mammogram screening/additional cancer screening options, as they get older.  Her daughter and niece are still very young, so cancer screening could change drastically by the time they would need to start thinking about this.  They should make their future primary doctors aware of the family history of cancer, so that they will be eligible for this screening at that point in time.

## 2015-12-27 NOTE — Progress Notes (Signed)
GENETIC TEST RESULT  HPI: Ms. Betty Ball was previously seen in the [facility] due to a personal history of borderline ovarian tumor and breast cancer at 26, family history of breast and other cancers, and concerns regarding a hereditary predisposition to cancer. Please refer to our prior cancer genetics clinic note from Dec 09, 2015 for more information regarding Betty Ball's medical, social and family histories, and our assessment and recommendations, at the time. Betty Ball recent genetic test results were disclosed to her, as were recommendations warranted by these results. These results and recommendations are discussed in more detail below.  GENETIC TEST RESULTS: At the time of Betty Ball's visit on 12/09/15, we recommended she pursue genetic testing of the 32-gene Comprehensive Cancer Panel with MSH2 Exons 1-7 Inversion Analysis through [facility]. The Comprehensive Cancer Panel offered by [facility] includes sequencing and/or deletion duplication testing of the following 32 genes: APC, ATM, AXIN2, BARD1, BMPR1A, BRCA1, BRCA2, BRIP1, CDH1, CDK4, CDKN2A, CHEK2, EPCAM, FANCC, MLH1, MSH2, MSH6, MUTYH, NBN, PALB2, PMS2, POLD1, POLE, PTEN, RAD51C, RAD51D, SCG5/GREM1, SMAD4, STK11, TP53, VHL, and XRCC2.  Those results are now back, the report date for which is Dec 24, 2015.  Genetic testing was normal, and did not reveal a deleterious mutation in these genes. Additionally, no variants of uncertain significance (VUSes) were found.  The test report will be scanned into [facility] and will be located under the Results Review tab in the Pathology>Molecular Pathology section.   We discussed with Betty Ball that since the current genetic testing is not perfect, it is possible there may be a gene mutation in one of these genes that current testing cannot detect, but that chance is small. We also discussed, that it is possible that another gene that has not yet been discovered, or that we have not yet  tested, is responsible for the cancer diagnoses in the family, and it is, therefore, important to remain in touch with cancer genetics in the future so that we can continue to offer Betty Ball the most up-to-date genetic testing.   CANCER SCREENING RECOMMENDATIONS: We still do not have an explanation for the personal or family history of cancer.  This result is likely reassuring and indicates that Betty Ball likely does not have an increased risk for a future cancer due to a mutation in one of these genes. This normal test also suggests that Betty Ball cancer was most likely not due to an inherited predisposition associated with one of these genes.  Additionally, many of the relatives who have had cancer are those that are more distant to Betty Ball--her mother, aunts and grandmothers have not had breast or ovarian cancer.  Most cancers happen by chance and this negative test suggests that her cancer falls into this category.  We, therefore, recommended she continue to follow the cancer management and screening guidelines provided by her oncology and primary healthcare providers.   RECOMMENDATIONS FOR FAMILY MEMBERS: Women in this family might be at some increased risk of developing cancer, over the general population risk, simply due to the family history of cancer. We recommended women in this family have a yearly mammogram beginning at age 70, or 67 years younger than the earliest onset of cancer, an an annual clinical breast exam, and perform monthly breast self-exams.  This would include Betty Ball's daughter and niece.  As they are still very young, they will need to make their future primary doctors aware of the family history of cancer, so that  they can receive the most appropriate cancer screening even as screening options/guidelines change in the future.  Women in this family should also have a gynecological exam as recommended by their primary provider. All family members should have a colonoscopy by  age 28.   FOLLOW-UP: Lastly, we discussed with Betty Ball that cancer genetics is a rapidly advancing field and it is possible that new genetic tests will be appropriate for her and/or her family members in the future. We encouraged her to remain in contact with cancer genetics on an annual basis so we can update her personal and family histories and let her know of advances in cancer genetics that may benefit this family.   Our contact number was provided. Betty Ball questions were answered to her satisfaction, and she knows she is welcome to call us at anytime with additional questions or concerns.   Jeanine Luz, MS, [facility], [place] Certified Genetic Counselor [email or web address] Phone: [phone]

## 2016-01-19 DIAGNOSIS — Z9013 Acquired absence of bilateral breasts and nipples: Secondary | ICD-10-CM | POA: Diagnosis not present

## 2016-01-19 DIAGNOSIS — Z803 Family history of malignant neoplasm of breast: Secondary | ICD-10-CM | POA: Diagnosis not present

## 2016-01-19 DIAGNOSIS — C50211 Malignant neoplasm of upper-inner quadrant of right female breast: Secondary | ICD-10-CM

## 2016-02-02 DIAGNOSIS — C50919 Malignant neoplasm of unspecified site of unspecified female breast: Secondary | ICD-10-CM

## 2016-02-15 DIAGNOSIS — Z170 Estrogen receptor positive status [ER+]: Secondary | ICD-10-CM | POA: Diagnosis not present

## 2016-02-15 DIAGNOSIS — C50919 Malignant neoplasm of unspecified site of unspecified female breast: Secondary | ICD-10-CM | POA: Diagnosis not present

## 2016-02-17 ENCOUNTER — Telehealth: Payer: Self-pay | Admitting: Genetic Counselor

## 2016-02-17 NOTE — Telephone Encounter (Signed)
Faxed genetic testing notes to [facility] [id number] release Id

## 2016-02-22 DIAGNOSIS — D709 Neutropenia, unspecified: Secondary | ICD-10-CM | POA: Diagnosis not present

## 2016-02-22 DIAGNOSIS — R112 Nausea with vomiting, unspecified: Secondary | ICD-10-CM | POA: Diagnosis not present

## 2016-02-22 DIAGNOSIS — R000 Tachycardia, unspecified: Secondary | ICD-10-CM

## 2016-02-22 DIAGNOSIS — C50919 Malignant neoplasm of unspecified site of unspecified female breast: Secondary | ICD-10-CM

## 2016-02-22 DIAGNOSIS — Z90710 Acquired absence of both cervix and uterus: Secondary | ICD-10-CM

## 2016-02-22 DIAGNOSIS — Z9013 Acquired absence of bilateral breasts and nipples: Secondary | ICD-10-CM | POA: Diagnosis not present

## 2016-02-22 DIAGNOSIS — R6510 Systemic inflammatory response syndrome (SIRS) of non-infectious origin without acute organ dysfunction: Secondary | ICD-10-CM | POA: Diagnosis not present

## 2016-02-22 DIAGNOSIS — N183 Chronic kidney disease, stage 3 (moderate): Secondary | ICD-10-CM

## 2016-02-23 DIAGNOSIS — R112 Nausea with vomiting, unspecified: Secondary | ICD-10-CM | POA: Diagnosis not present

## 2016-02-23 DIAGNOSIS — Z9013 Acquired absence of bilateral breasts and nipples: Secondary | ICD-10-CM | POA: Diagnosis not present

## 2016-02-23 DIAGNOSIS — D709 Neutropenia, unspecified: Secondary | ICD-10-CM | POA: Diagnosis not present

## 2016-02-23 DIAGNOSIS — R6510 Systemic inflammatory response syndrome (SIRS) of non-infectious origin without acute organ dysfunction: Secondary | ICD-10-CM | POA: Diagnosis not present

## 2016-02-24 DIAGNOSIS — D709 Neutropenia, unspecified: Secondary | ICD-10-CM | POA: Diagnosis not present

## 2016-02-24 DIAGNOSIS — R112 Nausea with vomiting, unspecified: Secondary | ICD-10-CM | POA: Diagnosis not present

## 2016-02-24 DIAGNOSIS — R6510 Systemic inflammatory response syndrome (SIRS) of non-infectious origin without acute organ dysfunction: Secondary | ICD-10-CM | POA: Diagnosis not present

## 2016-02-24 DIAGNOSIS — Z9013 Acquired absence of bilateral breasts and nipples: Secondary | ICD-10-CM | POA: Diagnosis not present

## 2016-02-25 DIAGNOSIS — R6510 Systemic inflammatory response syndrome (SIRS) of non-infectious origin without acute organ dysfunction: Secondary | ICD-10-CM | POA: Diagnosis not present

## 2016-02-25 DIAGNOSIS — Z9013 Acquired absence of bilateral breasts and nipples: Secondary | ICD-10-CM | POA: Diagnosis not present

## 2016-02-25 DIAGNOSIS — D709 Neutropenia, unspecified: Secondary | ICD-10-CM | POA: Diagnosis not present

## 2016-02-25 DIAGNOSIS — R112 Nausea with vomiting, unspecified: Secondary | ICD-10-CM | POA: Diagnosis not present

## 2016-03-14 DIAGNOSIS — C50919 Malignant neoplasm of unspecified site of unspecified female breast: Secondary | ICD-10-CM | POA: Diagnosis not present

## 2016-04-18 DIAGNOSIS — C50211 Malignant neoplasm of upper-inner quadrant of right female breast: Secondary | ICD-10-CM | POA: Diagnosis not present

## 2016-05-30 DIAGNOSIS — C50211 Malignant neoplasm of upper-inner quadrant of right female breast: Secondary | ICD-10-CM | POA: Diagnosis not present

## 2016-06-16 ENCOUNTER — Encounter (HOSPITAL_COMMUNITY): Payer: Self-pay

## 2016-06-27 ENCOUNTER — Encounter (HOSPITAL_BASED_OUTPATIENT_CLINIC_OR_DEPARTMENT_OTHER): Payer: Self-pay | Admitting: *Deleted

## 2016-06-27 NOTE — Pre-Procedure Instructions (Signed)
Last nephrology note req. from [facility].  Lab results req. from [facility].

## 2016-06-29 ENCOUNTER — Ambulatory Visit: Payer: Self-pay | Admitting: Plastic Surgery

## 2016-06-29 DIAGNOSIS — C50919 Malignant neoplasm of unspecified site of unspecified female breast: Secondary | ICD-10-CM

## 2016-06-29 DIAGNOSIS — Z9013 Acquired absence of bilateral breasts and nipples: Secondary | ICD-10-CM

## 2016-06-29 NOTE — H&P (Signed)
Betty Ball is an 40 y.o. female.   Chief Complaint: breast cancer history and acquired absence of breasts HPI: The patient is a 40 y.o. yrs old wf here for reconstruction of her chest wall.  She was diagnosed with RIGHT breast cancer earlier this year.  She underwent bilateral mastectomies with right LND (3 negative) May 20174. She has not had any radiation. She finished chemotherapy last week.  She is 5 feet 2 inches tall, weighs 196 pounds. Preop bra was 40 DDD.  She is interested in having the excess skin and fat removed so she can get prosthetics for her clothes.  She does not want reconstruction with autologous or implant based surgery.  She has significant amount of excess lateral / axillary tissue both skin and fat.  There is excess skin at the central breast area as well. This will make it difficult to keep clean and dry.  There is mild asymmetry.  Past Medical History:  Diagnosis Date  . Allergy-induced asthma    prn inhaler  . Anemia    takes iron supplement  . Bursitis    hips  . Chronic kidney disease (CKD), stage III (moderate)   . GERD (gastroesophageal reflux disease)   . Graves' disease   . History of breast cancer 12/2015   right  . History of chemotherapy    finished 05/2016  . History of kidney stones   . History of migraine headaches   . Seasonal allergies     Past Surgical History:  Procedure Laterality Date  . ABDOMINAL HYSTERECTOMY    . BILATERAL OOPHORECTOMY  10/26/2015  . BILATERAL SALPINGECTOMY N/A 04/06/2014   Procedure: BILATERAL SALPINGECTOMY;  Surgeon: Janyth Contes, MD;  Location: [facility];  Service: Obstetrics;  Laterality: N/A;  . BILATERAL TOTAL MASTECTOMY WITH AXILLARY LYMPH NODE DISSECTION  12/2015  . CESAREAN SECTION  2001, 2009  . CESAREAN SECTION N/A 04/06/2014   Procedure: CESAREAN SECTION;  Surgeon: Janyth Contes, MD;  Location: [facility];  Service: Obstetrics;  Laterality: N/A;  . CYSTOSCOPY W/ URETERAL STENT PLACEMENT Bilateral    . CYSTOSCOPY/RETROGRADE/URETEROSCOPY  06/11/2015  . EXCISION VAGINAL CYST  06/11/2015  . LAPAROSCOPIC HYSTERECTOMY  10/26/2015  . WISDOM TOOTH EXTRACTION      Family History  Problem Relation Age of Onset  . Other Mother     hx pre-cancerous skin findings; hysterectomy in 40s-50s   . Kidney disease Son     one son has kidney disease  . Colon polyps Maternal Aunt     (x2) maternal aunts have had multiple colonoscopies to remove multiple adenomatous colon polyps  . Alzheimer's disease Maternal Grandmother   . Heart attack Maternal Grandfather     d. 42s  . Melanoma Paternal Grandfather     stage IV; d. 71s  . Breast cancer Cousin 54    maternal 1st cousin; genetic testing reportedly negative in 2015  . Colon cancer Other     maternal great grandmother (MGM's mother) dx. at unspecified age  . Ovarian cancer Other     maternal great grandmother (MGM's mother) dx. at unspecified age  . Colon cancer Other     maternal great aunt (MGF's sister) dx. at unspecified age  . Breast cancer Other     maternal great aunt (MGF's sister) dx. at unspecified age  . Breast cancer Cousin     (x2) maternal 1st cousins, once-removed (MGF's side) dx. at unspecified ages  . Lung cancer Other     paternal great grandfather (  PGM's father) dx. at unspecified age   Social History:  reports that she quit smoking about 17 years ago. She smoked 0.00 packs per day for 0.00 years. She has never used smokeless tobacco. She reports that she does not drink alcohol or use drugs.  Allergies:  Allergies  Allergen Reactions  . Codeine Nausea And Vomiting  . Iodine Swelling  . Shellfish Allergy Other (See Comments)    THROAT SWELLING  . Keflex [Cephalexin] Rash     (Not in a hospital admission)  No results found for this or any previous visit (from the past 48 hour(s)). No results found.  Review of Systems  Constitutional: Negative.   HENT: Negative.   Eyes: Negative.   Respiratory: Negative.     Cardiovascular: Negative.   Gastrointestinal: Negative.   Genitourinary: Negative.   Musculoskeletal: Negative.   Skin: Negative.   Neurological: Negative.   Psychiatric/Behavioral: Negative.     unknown if currently breastfeeding. Physical Exam  Constitutional: She is oriented to person, place, and time. She appears well-developed and well-nourished.  HENT:  Head: Normocephalic and atraumatic.  Eyes: Conjunctivae and EOM are normal. Pupils are equal, round, and reactive to light.  Cardiovascular: Normal rate.   Respiratory: Effort normal.    GI: Soft. She exhibits no distension.  Neurological: She is alert and oriented to person, place, and time.  Skin: Skin is warm.  Psychiatric: She has a normal mood and affect. Her behavior is normal. Judgment and thought content normal.     Assessment/Plan Once all reconstruction options were presented, a focused discussion was had regarding the patient's suitability for each of these procedures. We discussed the limited options available for reconstruction if she has the excess tissue removed now.  She is aware and is sure she does not want reconstruction.  I agree for removal of the excess breast tissue and axillary tissue skin and fat with direct excision and liposuction.  Betty Going, DO 06/29/2016, 4:43 PM

## 2016-07-05 ENCOUNTER — Ambulatory Visit (HOSPITAL_BASED_OUTPATIENT_CLINIC_OR_DEPARTMENT_OTHER): Payer: BLUE CROSS/BLUE SHIELD | Admitting: Anesthesiology

## 2016-07-05 ENCOUNTER — Ambulatory Visit (HOSPITAL_BASED_OUTPATIENT_CLINIC_OR_DEPARTMENT_OTHER)
Admission: RE | Admit: 2016-07-05 | Discharge: 2016-07-05 | Disposition: A | Discharge status: Home / Self Care | Payer: BLUE CROSS/BLUE SHIELD | Source: Ambulatory Visit | Attending: Plastic Surgery | Admitting: Plastic Surgery

## 2016-07-05 ENCOUNTER — Encounter (HOSPITAL_BASED_OUTPATIENT_CLINIC_OR_DEPARTMENT_OTHER)
Admission: RE | Disposition: A | Discharge status: Home / Self Care | Payer: Self-pay | Source: Ambulatory Visit | Attending: Plastic Surgery

## 2016-07-05 ENCOUNTER — Encounter (HOSPITAL_BASED_OUTPATIENT_CLINIC_OR_DEPARTMENT_OTHER): Payer: Self-pay | Admitting: *Deleted

## 2016-07-05 DIAGNOSIS — Z9013 Acquired absence of bilateral breasts and nipples: Secondary | ICD-10-CM | POA: Insufficient documentation

## 2016-07-05 DIAGNOSIS — D649 Anemia, unspecified: Secondary | ICD-10-CM | POA: Insufficient documentation

## 2016-07-05 DIAGNOSIS — E669 Obesity, unspecified: Secondary | ICD-10-CM | POA: Diagnosis not present

## 2016-07-05 DIAGNOSIS — Z79899 Other long term (current) drug therapy: Secondary | ICD-10-CM | POA: Insufficient documentation

## 2016-07-05 DIAGNOSIS — Z803 Family history of malignant neoplasm of breast: Secondary | ICD-10-CM | POA: Diagnosis not present

## 2016-07-05 DIAGNOSIS — C50919 Malignant neoplasm of unspecified site of unspecified female breast: Secondary | ICD-10-CM

## 2016-07-05 DIAGNOSIS — E0500 Thyrotoxicosis with diffuse goiter without thyrotoxic crisis or storm: Secondary | ICD-10-CM | POA: Diagnosis not present

## 2016-07-05 DIAGNOSIS — K219 Gastro-esophageal reflux disease without esophagitis: Secondary | ICD-10-CM | POA: Insufficient documentation

## 2016-07-05 DIAGNOSIS — Z87891 Personal history of nicotine dependence: Secondary | ICD-10-CM | POA: Diagnosis not present

## 2016-07-05 DIAGNOSIS — F329 Major depressive disorder, single episode, unspecified: Secondary | ICD-10-CM | POA: Insufficient documentation

## 2016-07-05 DIAGNOSIS — L905 Scar conditions and fibrosis of skin: Secondary | ICD-10-CM | POA: Insufficient documentation

## 2016-07-05 DIAGNOSIS — Z6836 Body mass index (BMI) 36.0-36.9, adult: Secondary | ICD-10-CM | POA: Insufficient documentation

## 2016-07-05 DIAGNOSIS — N6489 Other specified disorders of breast: Secondary | ICD-10-CM | POA: Diagnosis present

## 2016-07-05 DIAGNOSIS — N183 Chronic kidney disease, stage 3 (moderate): Secondary | ICD-10-CM | POA: Insufficient documentation

## 2016-07-05 DIAGNOSIS — J45909 Unspecified asthma, uncomplicated: Secondary | ICD-10-CM | POA: Insufficient documentation

## 2016-07-05 DIAGNOSIS — Z9221 Personal history of antineoplastic chemotherapy: Secondary | ICD-10-CM | POA: Diagnosis not present

## 2016-07-05 DIAGNOSIS — Z853 Personal history of malignant neoplasm of breast: Secondary | ICD-10-CM | POA: Diagnosis not present

## 2016-07-05 HISTORY — DX: Personal history of malignant neoplasm of breast: Z85.3

## 2016-07-05 HISTORY — DX: Unspecified asthma, uncomplicated: J45.909

## 2016-07-05 HISTORY — DX: Personal history of other diseases of the nervous system and sense organs: Z86.69

## 2016-07-05 HISTORY — DX: Anemia, unspecified: D64.9

## 2016-07-05 HISTORY — DX: Personal history of antineoplastic chemotherapy: Z92.21

## 2016-07-05 HISTORY — PX: REMOVAL OF BILATERAL TISSUE EXPANDERS WITH PLACEMENT OF BILATERAL BREAST IMPLANTS: SHX6431

## 2016-07-05 HISTORY — DX: Chronic kidney disease, stage 3 (moderate): N18.3

## 2016-07-05 HISTORY — DX: Chronic kidney disease, stage 3 unspecified: N18.30

## 2016-07-05 HISTORY — DX: Personal history of urinary calculi: Z87.442

## 2016-07-05 HISTORY — DX: Gastro-esophageal reflux disease without esophagitis: K21.9

## 2016-07-05 HISTORY — DX: Thyrotoxicosis with diffuse goiter without thyrotoxic crisis or storm: E05.00

## 2016-07-05 SURGERY — REMOVAL, TISSUE EXPANDER, BREAST, BILATERAL, WITH BILATERAL IMPLANT IMPLANT INSERTION
Anesthesia: General | Site: Breast | Laterality: Bilateral

## 2016-07-05 MED ORDER — SCOPOLAMINE 1 MG/3DAYS TD PT72: 1.0000 | MEDICATED_PATCH | TRANSDERMAL | Status: DC

## 2016-07-05 MED ORDER — PROPOFOL 10 MG/ML IV BOLUS
INTRAVENOUS | Status: DC | PRN
  Administered 2016-07-05: 150 mg via INTRAVENOUS
  Administered 2016-07-05: 100 mg via INTRAVENOUS
  Administered 2016-07-05: 50 mg via INTRAVENOUS

## 2016-07-05 MED ORDER — SUCCINYLCHOLINE CHLORIDE 20 MG/ML IJ SOLN
INTRAMUSCULAR | Status: DC | PRN
  Administered 2016-07-05: 100 mg via INTRAVENOUS

## 2016-07-05 MED ORDER — LIDOCAINE-EPINEPHRINE 1 %-1:100000 IJ SOLN
INTRAMUSCULAR | Status: DC | PRN
  Administered 2016-07-05: 50 mL

## 2016-07-05 MED ORDER — LACTATED RINGERS IV SOLN
INTRAVENOUS | Status: DC | PRN
  Administered 2016-07-05: 500 mL via INTRAVENOUS

## 2016-07-05 MED ORDER — LIDOCAINE HCL (PF) 1 % IJ SOLN
INTRAMUSCULAR | Status: AC
  Filled 2016-07-05: qty 60

## 2016-07-05 MED ORDER — DEXAMETHASONE SODIUM PHOSPHATE 10 MG/ML IJ SOLN
INTRAMUSCULAR | Status: AC
  Filled 2016-07-05: qty 1

## 2016-07-05 MED ORDER — DEXAMETHASONE SODIUM PHOSPHATE 4 MG/ML IJ SOLN
INTRAMUSCULAR | Status: DC | PRN
  Administered 2016-07-05: 10 mg via INTRAVENOUS

## 2016-07-05 MED ORDER — ONDANSETRON HCL 4 MG/2ML IJ SOLN
INTRAMUSCULAR | Status: DC | PRN
  Administered 2016-07-05: 4 mg via INTRAVENOUS

## 2016-07-05 MED ORDER — ONDANSETRON HCL 4 MG/2ML IJ SOLN
INTRAMUSCULAR | Status: AC
  Filled 2016-07-05: qty 2

## 2016-07-05 MED ORDER — FENTANYL CITRATE (PF) 100 MCG/2ML IJ SOLN
INTRAMUSCULAR | Status: AC
  Filled 2016-07-05: qty 2

## 2016-07-05 MED ORDER — FENTANYL CITRATE (PF) 100 MCG/2ML IJ SOLN
50.0000 ug | INTRAMUSCULAR | Status: DC | PRN
  Administered 2016-07-05: 100 ug via INTRAVENOUS
  Administered 2016-07-05: 50 ug via INTRAVENOUS

## 2016-07-05 MED ORDER — MIDAZOLAM HCL 2 MG/2ML IJ SOLN
1.0000 mg | INTRAMUSCULAR | Status: DC | PRN
  Administered 2016-07-05: 2 mg via INTRAVENOUS

## 2016-07-05 MED ORDER — MEPERIDINE HCL 25 MG/ML IJ SOLN: 6.2500 mg | INTRAMUSCULAR | Status: DC | PRN

## 2016-07-05 MED ORDER — MIDAZOLAM HCL 2 MG/2ML IJ SOLN
INTRAMUSCULAR | Status: AC
  Filled 2016-07-05: qty 2

## 2016-07-05 MED ORDER — LIDOCAINE 2% (20 MG/ML) 5 ML SYRINGE
INTRAMUSCULAR | Status: AC
  Filled 2016-07-05: qty 5

## 2016-07-05 MED ORDER — SCOPOLAMINE 1 MG/3DAYS TD PT72: 1.0000 | MEDICATED_PATCH | Freq: Once | TRANSDERMAL | Status: DC | PRN

## 2016-07-05 MED ORDER — HYDROMORPHONE HCL 1 MG/ML IJ SOLN
0.2500 mg | INTRAMUSCULAR | Status: DC | PRN
  Administered 2016-07-05 (×2): 0.5 mg via INTRAVENOUS

## 2016-07-05 MED ORDER — SUCCINYLCHOLINE CHLORIDE 200 MG/10ML IV SOSY
PREFILLED_SYRINGE | INTRAVENOUS | Status: AC
  Filled 2016-07-05: qty 10

## 2016-07-05 MED ORDER — LACTATED RINGERS IV SOLN
INTRAVENOUS | Status: DC
  Administered 2016-07-05 (×2): via INTRAVENOUS

## 2016-07-05 MED ORDER — ARTIFICIAL TEARS OP OINT
TOPICAL_OINTMENT | OPHTHALMIC | Status: AC
  Filled 2016-07-05: qty 3.5

## 2016-07-05 MED ORDER — LIDOCAINE HCL (CARDIAC) 20 MG/ML IV SOLN
INTRAVENOUS | Status: DC | PRN
  Administered 2016-07-05: 40 mg via INTRAVENOUS

## 2016-07-05 MED ORDER — PHENYLEPHRINE HCL 10 MG/ML IJ SOLN
INTRAMUSCULAR | Status: DC | PRN
Start: 2016-07-05 — End: 2016-07-05
  Administered 2016-07-05: 40 ug via INTRAVENOUS
  Administered 2016-07-05 (×2): 80 ug via INTRAVENOUS

## 2016-07-05 MED ORDER — METOCLOPRAMIDE HCL 5 MG/ML IJ SOLN: 10.0000 mg | Freq: Once | INTRAMUSCULAR | Status: DC | PRN

## 2016-07-05 MED ORDER — CIPROFLOXACIN IN D5W 400 MG/200ML IV SOLN
400.0000 mg | INTRAVENOUS | Status: AC
  Administered 2016-07-05: 400 mg via INTRAVENOUS

## 2016-07-05 MED ORDER — EPHEDRINE SULFATE 50 MG/ML IJ SOLN
INTRAMUSCULAR | Status: DC | PRN
  Administered 2016-07-05: 10 mg via INTRAVENOUS
  Administered 2016-07-05: 5 mg via INTRAVENOUS
  Administered 2016-07-05: 20 mg via INTRAVENOUS
  Administered 2016-07-05 (×2): 10 mg via INTRAVENOUS

## 2016-07-05 MED ORDER — BUPIVACAINE-EPINEPHRINE 0.25% -1:200000 IJ SOLN
INTRAMUSCULAR | Status: DC | PRN
  Administered 2016-07-05: 8 mL

## 2016-07-05 MED ORDER — EPINEPHRINE PF 1 MG/ML IJ SOLN
INTRAMUSCULAR | Status: DC | PRN
  Administered 2016-07-05: 1 mg

## 2016-07-05 MED ORDER — PHENYLEPHRINE 40 MCG/ML (10ML) SYRINGE FOR IV PUSH (FOR BLOOD PRESSURE SUPPORT)
PREFILLED_SYRINGE | INTRAVENOUS | Status: AC
  Filled 2016-07-05: qty 10

## 2016-07-05 MED ORDER — HYDROMORPHONE HCL 1 MG/ML IJ SOLN
INTRAMUSCULAR | Status: AC
  Filled 2016-07-05: qty 1

## 2016-07-05 MED ORDER — BUPIVACAINE-EPINEPHRINE (PF) 0.25% -1:200000 IJ SOLN
INTRAMUSCULAR | Status: AC
  Filled 2016-07-05: qty 90

## 2016-07-05 MED ORDER — CIPROFLOXACIN IN D5W 400 MG/200ML IV SOLN
INTRAVENOUS | Status: AC
  Filled 2016-07-05: qty 200

## 2016-07-05 SURGICAL SUPPLY — 76 items
ADH SKN CLS APL DERMABOND .7 (GAUZE/BANDAGES/DRESSINGS) ×6
BAG DECANTER FOR FLEXI CONT (MISCELLANEOUS) ×3 IMPLANT
BINDER ABDOMINAL  9 SM 30-45 (SOFTGOODS)
BINDER ABDOMINAL 10 UNV 27-48 (MISCELLANEOUS) IMPLANT
BINDER ABDOMINAL 12 SM 30-45 (SOFTGOODS) IMPLANT
BINDER ABDOMINAL 9 SM 30-45 (SOFTGOODS) IMPLANT
BINDER BREAST LRG (GAUZE/BANDAGES/DRESSINGS) IMPLANT
BINDER BREAST MEDIUM (GAUZE/BANDAGES/DRESSINGS) IMPLANT
BINDER BREAST XLRG (GAUZE/BANDAGES/DRESSINGS) ×2 IMPLANT
BINDER BREAST XXLRG (GAUZE/BANDAGES/DRESSINGS) IMPLANT
BIOPATCH RED 1 DISK 7.0 (GAUZE/BANDAGES/DRESSINGS) ×4 IMPLANT
BLADE HEX COATED 2.75 (ELECTRODE) ×3 IMPLANT
BLADE SURG 15 STRL LF DISP TIS (BLADE) ×4 IMPLANT
BLADE SURG 15 STRL SS (BLADE) ×6
BNDG GAUZE ELAST 4 BULKY (GAUZE/BANDAGES/DRESSINGS) ×6 IMPLANT
CANISTER SUCT 1200ML W/VALVE (MISCELLANEOUS) ×3 IMPLANT
CHLORAPREP W/TINT 26ML (MISCELLANEOUS) ×3 IMPLANT
CORDS BIPOLAR (ELECTRODE) IMPLANT
COVER BACK TABLE 60X90IN (DRAPES) ×3 IMPLANT
COVER MAYO STAND STRL (DRAPES) ×3 IMPLANT
DECANTER SPIKE VIAL GLASS SM (MISCELLANEOUS) IMPLANT
DERMABOND ADVANCED (GAUZE/BANDAGES/DRESSINGS) ×3
DERMABOND ADVANCED .7 DNX12 (GAUZE/BANDAGES/DRESSINGS) ×3 IMPLANT
DRAIN CHANNEL 19F RND (DRAIN) ×4 IMPLANT
DRAPE LAPAROSCOPIC ABDOMINAL (DRAPES) ×3 IMPLANT
DRSG PAD ABDOMINAL 8X10 ST (GAUZE/BANDAGES/DRESSINGS) ×6 IMPLANT
ELECT BLADE 4.0 EZ CLEAN MEGAD (MISCELLANEOUS) ×3
ELECT REM PT RETURN 9FT ADLT (ELECTROSURGICAL) ×3
ELECTRODE BLDE 4.0 EZ CLN MEGD (MISCELLANEOUS) ×2 IMPLANT
ELECTRODE REM PT RTRN 9FT ADLT (ELECTROSURGICAL) ×2 IMPLANT
EVACUATOR SILICONE 100CC (DRAIN) ×4 IMPLANT
EXTRACTOR CANIST REVOLVE STRL (CANNISTER) ×1 IMPLANT
FILTER LIPOSUCTION (MISCELLANEOUS) ×3 IMPLANT
GLOVE BIO SURGEON STRL SZ 6.5 (GLOVE) ×18 IMPLANT
GOWN STRL REUS W/ TWL LRG LVL3 (GOWN DISPOSABLE) ×4 IMPLANT
GOWN STRL REUS W/TWL LRG LVL3 (GOWN DISPOSABLE) ×6
IV LACTATED RINGERS 1000ML (IV SOLUTION) ×5 IMPLANT
IV NS 1000ML (IV SOLUTION)
IV NS 1000ML BAXH (IV SOLUTION) IMPLANT
IV NS 500ML (IV SOLUTION)
IV NS 500ML BAXH (IV SOLUTION) IMPLANT
KIT FILL SYSTEM UNIVERSAL (SET/KITS/TRAYS/PACK) IMPLANT
LINER CANISTER 1000CC FLEX (MISCELLANEOUS) ×3 IMPLANT
NDL HYPO 25X1 1.5 SAFETY (NEEDLE) IMPLANT
NDL SAFETY ECLIPSE 18X1.5 (NEEDLE) ×2 IMPLANT
NEEDLE HYPO 18GX1.5 SHARP (NEEDLE) ×3
NEEDLE HYPO 25X1 1.5 SAFETY (NEEDLE) ×3 IMPLANT
PACK BASIN DAY SURGERY FS (CUSTOM PROCEDURE TRAY) ×3 IMPLANT
PAD ALCOHOL SWAB (MISCELLANEOUS) ×3 IMPLANT
PENCIL BUTTON HOLSTER BLD 10FT (ELECTRODE) ×3 IMPLANT
PIN SAFETY STERILE (MISCELLANEOUS) ×2 IMPLANT
SLEEVE SCD COMPRESS KNEE MED (MISCELLANEOUS) ×3 IMPLANT
SPONGE GAUZE 4X4 12PLY STER LF (GAUZE/BANDAGES/DRESSINGS) IMPLANT
SPONGE LAP 18X18 X RAY DECT (DISPOSABLE) ×6 IMPLANT
SUT MNCRL AB 4-0 PS2 18 (SUTURE) ×11 IMPLANT
SUT MON AB 3-0 SH 27 (SUTURE) ×6
SUT MON AB 3-0 SH27 (SUTURE) ×3 IMPLANT
SUT MON AB 5-0 PS2 18 (SUTURE) ×8 IMPLANT
SUT PDS AB 2-0 CT2 27 (SUTURE) IMPLANT
SUT SILK 3 0 PS 1 (SUTURE) ×4 IMPLANT
SUT VIC AB 3-0 SH 27 (SUTURE)
SUT VIC AB 3-0 SH 27X BRD (SUTURE) IMPLANT
SUT VICRYL 4-0 PS2 18IN ABS (SUTURE) IMPLANT
SYR 10ML LL (SYRINGE) ×12 IMPLANT
SYR 20CC LL (SYRINGE) IMPLANT
SYR 3ML 18GX1 1/2 (SYRINGE) IMPLANT
SYR 50ML LL SCALE MARK (SYRINGE) ×4 IMPLANT
SYR BULB IRRIGATION 50ML (SYRINGE) ×3 IMPLANT
SYR CONTROL 10ML LL (SYRINGE) ×2 IMPLANT
SYR TOOMEY 50ML (SYRINGE) ×3 IMPLANT
TOWEL OR 17X24 6PK STRL BLUE (TOWEL DISPOSABLE) ×6 IMPLANT
TUBE CONNECTING 20X1/4 (TUBING) ×3 IMPLANT
TUBING INFILTRATION IT-10001 (TUBING) ×2 IMPLANT
TUBING SET GRADUATE ASPIR 12FT (MISCELLANEOUS) ×3 IMPLANT
UNDERPAD 30X30 (UNDERPADS AND DIAPERS) ×6 IMPLANT
YANKAUER SUCT BULB TIP NO VENT (SUCTIONS) ×3 IMPLANT

## 2016-07-05 NOTE — Anesthesia Postprocedure Evaluation (Signed)
Anesthesia Post Note  Patient: Betty Ball  Procedure(s) Performed: Procedure(s) (LRB): EXCISION OF BILATERAL BREAST TISSUE AND LIPOSUCTION FOR SYMMETRY (Bilateral)  Patient location during evaluation: PACU Anesthesia Type: General Level of consciousness: awake and alert and oriented Pain management: pain level controlled Vital Signs Assessment: post-procedure vital signs reviewed and stable Respiratory status: spontaneous breathing, nonlabored ventilation and respiratory function stable Cardiovascular status: blood pressure returned to baseline and stable Postop Assessment: no signs of nausea or vomiting Anesthetic complications: no    Last Vitals:  Vitals:   07/05/16 1230 07/05/16 1245  BP: 129/83 121/81  Pulse: (!) 120 (!) 121  Resp: 15 (!) 22  Temp:      Last Pain:  Vitals:   07/05/16 1245  TempSrc:   PainSc: 4                  Shaden Lacher A.

## 2016-07-05 NOTE — Op Note (Signed)
DATE OF OPERATION: 07/05/2016  LOCATION: [facility]  PREOPERATIVE DIAGNOSIS:  1. Acquired absence of breasts 2. History of Breast cancer 3. Breast Asymmetry  POSTOPERATIVE DIAGNOSIS: Same  PROCEDURE: Bilateral excision of excess breast tissue 5 x 15 cm and release of capsule scar contracture 4 x 7 cm.   SURGEON: Rhonda Vangieson Sanger Nyzir Dubois, DO  ASSISTANT: Shawn Rayburn, PA  EBL: 50 cc  CONDITION: Stable  COMPLICATIONS: None  INDICATION: The patient, Betty Ball, is a 40 y.o. female born on 1975-09-17, is here for treatment of bilateral acquired absence of breast tissue due to surgery after breast cancer.  She was very uncomfortable with the excess tissue and rolls.  The decision was made for surgery to gain symmetry.  She stated definitively that she did not want reconstruction in the future.  PROCEDURE DETAILS:  The patient was seen prior to surgery and marked.  The IV antibiotics were given. The patient was taken to the operating room and given a general anesthetic. A standard time out was performed and all information was confirmed by those in the room. SCDs were placed.   The area was prepared and draped in a sterile fashion.  The incision lines were marked again as they were preoperatively.  The local was injected into the drawn lines.  The #15 blade was used to make a small incision and tumescent was placed in each breast in the lateral aspect at the axillary area.  Liposuction was done laterally to improve the contour and remove some of the bulk on each side.    Right:  The #10 blade was used to to excise the skin and fat at the rolls 5 x 15 cm with electrocautery for hemostasis. Attention was given to remove the thickness and not where it was thin.  The pectoralis muscle was released to aid in closure and release the capsule contracture 4 x 7 cm.  A drain was placed.  The deep layers were closed with 3-0 Monocryl.  The 4-0 and 5-0 Monocryl was then used for  closure of the skin edges.  The drain was secured with the 4-0 Silk.   Left:  The #10 blade was used to to excise the skin and fat at the rolls 5 x 15 cm with electrocautery for hemostasis. Attention was given to remove the thickness and not where it was thin.  The pectoralis muscle was released to aid in closure and release the capsule contracture 4 x 7 cm.  A drain was placed.  The deep layers were closed with 3-0 Monocryl.  The 4-0 and 5-0 Monocryl was then used for closure of the skin edges.  The drain was secured with the 4-0 Silk.  The patient was allowed to wake up and taken to recovery room in stable condition at the end of the case. The family was notified at the end of the case.

## 2016-07-05 NOTE — Discharge Instructions (Signed)
° ° ° °  JP Drain [employer] this sheet to all of your post-operative appointments while you have your drains.  Please measure your drains by CC's or ML's.  Make sure you drain and measure your JP Drains 2 or 3 times per day.  At the end of each day, add up totals for the left side and add up totals for the right side.    ( 9 am )     ( 3 pm )        ( 9 pm )                Date L  R  L  R  L  R  Total L/R                                                                                                                                                                                            Post Anesthesia Home Care Instructions  Activity: Get plenty of rest for the remainder of the day. A responsible adult should stay with you for 24 hours following the procedure.  For the next 24 hours, DO NOT: -Drive a car -Paediatric nurse -Drink alcoholic beverages -Take any medication unless instructed by your physician -Make any legal decisions or sign important papers.  Meals: Start with liquid foods such as gelatin or soup. Progress to regular foods as tolerated. Avoid greasy, spicy, heavy foods. If nausea and/or vomiting occur, drink only clear liquids until the nausea and/or vomiting subsides. Call your physician if vomiting continues.  Special Instructions/Symptoms: Your throat may feel dry or sore from the anesthesia or the breathing tube placed in your throat during surgery. If this causes discomfort, gargle with warm salt water. The discomfort should disappear within 24 hours.  If you had a scopolamine patch placed behind your ear for the management of post- operative nausea and/or vomiting:  1. The medication in the patch is effective for 72 hours, after which it should be removed.  Wrap patch in a tissue and discard in the trash. Wash hands thoroughly with soap and water. 2. You may remove the patch earlier than 72 hours if you experience unpleasant side effects  which may include dry mouth, dizziness or visual disturbances. 3. Avoid touching the patch. Wash your hands with soap and water after contact with the patch.       No heavy lifting No shower, sink bath Continue binder or sports bra

## 2016-07-05 NOTE — Interval H&P Note (Signed)
History and Physical Interval Note:  07/05/2016 9:25 AM  Betty Ball  has presented today for surgery, with the diagnosis of RIGHT BREAST CANCER  The various methods of treatment have been discussed with the patient and family. After consideration of risks, benefits and other options for treatment, the patient has consented to  Procedure(s): EXCISION OF BILATERAL BREAST TISSUE AND LIPOSUCTION FOR SYMMETRY (Bilateral) LIPOSUCTION WITH LIPOFILLING (Bilateral) as a surgical intervention .  The patient's history has been reviewed, patient examined, no change in status, stable for surgery.  I have reviewed the patient's chart and labs.  Questions were answered to the patient's satisfaction.     Wallace Going

## 2016-07-05 NOTE — Transfer of Care (Signed)
Immediate Anesthesia Transfer of Care Note  Patient: Betty Ball  Procedure(s) Performed: Procedure(s): EXCISION OF BILATERAL BREAST TISSUE AND LIPOSUCTION FOR SYMMETRY (Bilateral)  Patient Location: PACU  Anesthesia Type:General  Level of Consciousness: awake  Airway & Oxygen Therapy: Patient Spontanous Breathing and Patient connected to face mask oxygen  Post-op Assessment: Report given to RN and Post -op Vital signs reviewed and stable  Post vital signs: Reviewed and stable  Last Vitals:  Vitals:   07/05/16 0819  BP: 129/84  Pulse: 85  Resp: 18  Temp: 36.8 C    Last Pain:  Vitals:   07/05/16 0819  TempSrc: Oral      Patients Stated Pain Goal: 0 ([id number])  Complications: No apparent anesthesia complications

## 2016-07-05 NOTE — H&P (View-Only) (Signed)
Betty Ball is an 40 y.o. female.   Chief Complaint: breast cancer history and acquired absence of breasts HPI: The patient is a 40 y.o. yrs old wf here for reconstruction of her chest wall.  She was diagnosed with RIGHT breast cancer earlier this year.  She underwent bilateral mastectomies with right LND (3 negative) May 20174. She has not had any radiation. She finished chemotherapy last week.  She is 5 feet 2 inches tall, weighs 196 pounds. Preop bra was 40 DDD.  She is interested in having the excess skin and fat removed so she can get prosthetics for her clothes.  She does not want reconstruction with autologous or implant based surgery.  She has significant amount of excess lateral / axillary tissue both skin and fat.  There is excess skin at the central breast area as well. This will make it difficult to keep clean and dry.  There is mild asymmetry.  Past Medical History:  Diagnosis Date  . Allergy-induced asthma    prn inhaler  . Anemia    takes iron supplement  . Bursitis    hips  . Chronic kidney disease (CKD), stage III (moderate)   . GERD (gastroesophageal reflux disease)   . Graves' disease   . History of breast cancer 12/2015   right  . History of chemotherapy    finished 05/2016  . History of kidney stones   . History of migraine headaches   . Seasonal allergies     Past Surgical History:  Procedure Laterality Date  . ABDOMINAL HYSTERECTOMY    . BILATERAL OOPHORECTOMY  10/26/2015  . BILATERAL SALPINGECTOMY N/A 04/06/2014   Procedure: BILATERAL SALPINGECTOMY;  Surgeon: Janyth Contes, MD;  Location: [facility];  Service: Obstetrics;  Laterality: N/A;  . BILATERAL TOTAL MASTECTOMY WITH AXILLARY LYMPH NODE DISSECTION  12/2015  . CESAREAN SECTION  2001, 2009  . CESAREAN SECTION N/A 04/06/2014   Procedure: CESAREAN SECTION;  Surgeon: Janyth Contes, MD;  Location: [facility];  Service: Obstetrics;  Laterality: N/A;  . CYSTOSCOPY W/ URETERAL STENT PLACEMENT Bilateral    . CYSTOSCOPY/RETROGRADE/URETEROSCOPY  06/11/2015  . EXCISION VAGINAL CYST  06/11/2015  . LAPAROSCOPIC HYSTERECTOMY  10/26/2015  . WISDOM TOOTH EXTRACTION      Family History  Problem Relation Age of Onset  . Other Mother     hx pre-cancerous skin findings; hysterectomy in 40s-50s   . Kidney disease Son     one son has kidney disease  . Colon polyps Maternal Aunt     (x2) maternal aunts have had multiple colonoscopies to remove multiple adenomatous colon polyps  . Alzheimer's disease Maternal Grandmother   . Heart attack Maternal Grandfather     d. 70s  . Melanoma Paternal Grandfather     stage IV; d. 39s  . Breast cancer Cousin 27    maternal 1st cousin; genetic testing reportedly negative in 2015  . Colon cancer Other     maternal great grandmother (MGM's mother) dx. at unspecified age  . Ovarian cancer Other     maternal great grandmother (MGM's mother) dx. at unspecified age  . Colon cancer Other     maternal great aunt (MGF's sister) dx. at unspecified age  . Breast cancer Other     maternal great aunt (MGF's sister) dx. at unspecified age  . Breast cancer Cousin     (x2) maternal 1st cousins, once-removed (MGF's side) dx. at unspecified ages  . Lung cancer Other     paternal great grandfather (  PGM's father) dx. at unspecified age   Social History:  reports that she quit smoking about 17 years ago. She smoked 0.00 packs per day for 0.00 years. She has never used smokeless tobacco. She reports that she does not drink alcohol or use drugs.  Allergies:  Allergies  Allergen Reactions  . Codeine Nausea And Vomiting  . Iodine Swelling  . Shellfish Allergy Other (See Comments)    THROAT SWELLING  . Keflex [Cephalexin] Rash     (Not in a hospital admission)  No results found for this or any previous visit (from the past 48 hour(s)). No results found.  Review of Systems  Constitutional: Negative.   HENT: Negative.   Eyes: Negative.   Respiratory: Negative.     Cardiovascular: Negative.   Gastrointestinal: Negative.   Genitourinary: Negative.   Musculoskeletal: Negative.   Skin: Negative.   Neurological: Negative.   Psychiatric/Behavioral: Negative.     unknown if currently breastfeeding. Physical Exam  Constitutional: She is oriented to person, place, and time. She appears well-developed and well-nourished.  HENT:  Head: Normocephalic and atraumatic.  Eyes: Conjunctivae and EOM are normal. Pupils are equal, round, and reactive to light.  Cardiovascular: Normal rate.   Respiratory: Effort normal.    GI: Soft. She exhibits no distension.  Neurological: She is alert and oriented to person, place, and time.  Skin: Skin is warm.  Psychiatric: She has a normal mood and affect. Her behavior is normal. Judgment and thought content normal.     Assessment/Plan Once all reconstruction options were presented, a focused discussion was had regarding the patient's suitability for each of these procedures. We discussed the limited options available for reconstruction if she has the excess tissue removed now.  She is aware and is sure she does not want reconstruction.  I agree for removal of the excess breast tissue and axillary tissue skin and fat with direct excision and liposuction.  Wallace Going, DO 06/29/2016, 4:43 PM

## 2016-07-05 NOTE — Anesthesia Preprocedure Evaluation (Addendum)
Anesthesia Evaluation  Patient identified by MRN, date of birth, ID band Patient awake    Reviewed: Allergy & Precautions, NPO status , Patient's Chart, lab work & pertinent test results  Airway Mallampati: II       Dental  (+) Teeth Intact   Pulmonary asthma , former smoker,    Pulmonary exam normal breath sounds clear to auscultation       Cardiovascular negative cardio ROS Normal cardiovascular exam Rhythm:Regular Rate:Normal     Neuro/Psych Depression    GI/Hepatic Neg liver ROS, GERD  Medicated and Controlled,  Endo/Other  Hyperthyroidism Hx/o Grave's Disease Obesity Right breast Ca  Renal/GU Renal diseaseHx/o renal calculi  negative genitourinary   Musculoskeletal negative musculoskeletal ROS (+)   Abdominal (+) + obese,   Peds  Hematology  (+) anemia ,   Anesthesia Other Findings   Reproductive/Obstetrics                            Anesthesia Physical Anesthesia Plan  ASA: II  Anesthesia Plan: General   Post-op Pain Management:    Induction: Intravenous  Airway Management Planned: Oral ETT  Additional Equipment:   Intra-op Plan:   Post-operative Plan: Extubation in OR  Informed Consent: I have reviewed the patients History and Physical, chart, labs and discussed the procedure including the risks, benefits and alternatives for the proposed anesthesia with the patient or authorized representative who has indicated his/her understanding and acceptance.   Dental advisory given  Plan Discussed with:   Anesthesia Plan Comments:         Anesthesia Quick Evaluation

## 2016-07-05 NOTE — Anesthesia Procedure Notes (Signed)
Procedure Name: Intubation Date/Time: 07/05/2016 10:03 AM Performed by: Lieutenant Diego Pre-anesthesia Checklist: Patient identified, Emergency Drugs available, Suction available and Patient being monitored Patient Re-evaluated:Patient Re-evaluated prior to inductionOxygen Delivery Method: Circle system utilized Preoxygenation: Pre-oxygenation with 100% oxygen Intubation Type: IV induction Ventilation: Mask ventilation without difficulty Laryngoscope Size: Mac and 3 Grade View: Grade I Tube type: Oral Tube size: 7.0 mm Number of attempts: 1 Airway Equipment and Method: Stylet and Oral airway Placement Confirmation: ETT inserted through vocal cords under direct vision,  positive ETCO2 and breath sounds checked- equal and bilateral Tube secured with: Tape Dental Injury: Teeth and Oropharynx as per pre-operative assessment

## 2016-07-06 ENCOUNTER — Encounter (HOSPITAL_BASED_OUTPATIENT_CLINIC_OR_DEPARTMENT_OTHER): Payer: Self-pay | Admitting: Plastic Surgery

## 2016-08-14 DIAGNOSIS — Z79811 Long term (current) use of aromatase inhibitors: Secondary | ICD-10-CM | POA: Diagnosis not present

## 2016-08-14 DIAGNOSIS — Z9013 Acquired absence of bilateral breasts and nipples: Secondary | ICD-10-CM | POA: Diagnosis not present

## 2016-08-14 DIAGNOSIS — G620 Drug-induced polyneuropathy: Secondary | ICD-10-CM | POA: Diagnosis not present

## 2016-08-14 DIAGNOSIS — C50211 Malignant neoplasm of upper-inner quadrant of right female breast: Secondary | ICD-10-CM | POA: Diagnosis not present

## 2016-08-14 DIAGNOSIS — Z9221 Personal history of antineoplastic chemotherapy: Secondary | ICD-10-CM

## 2016-12-12 DIAGNOSIS — Z9221 Personal history of antineoplastic chemotherapy: Secondary | ICD-10-CM | POA: Diagnosis not present

## 2016-12-12 DIAGNOSIS — Z9013 Acquired absence of bilateral breasts and nipples: Secondary | ICD-10-CM | POA: Diagnosis not present

## 2016-12-12 DIAGNOSIS — Z170 Estrogen receptor positive status [ER+]: Secondary | ICD-10-CM | POA: Diagnosis not present

## 2016-12-12 DIAGNOSIS — R000 Tachycardia, unspecified: Secondary | ICD-10-CM | POA: Diagnosis not present

## 2016-12-12 DIAGNOSIS — Z79811 Long term (current) use of aromatase inhibitors: Secondary | ICD-10-CM | POA: Diagnosis not present

## 2016-12-12 DIAGNOSIS — C50919 Malignant neoplasm of unspecified site of unspecified female breast: Secondary | ICD-10-CM | POA: Diagnosis not present

## 2017-04-13 DIAGNOSIS — C50919 Malignant neoplasm of unspecified site of unspecified female breast: Secondary | ICD-10-CM | POA: Diagnosis not present

## 2017-04-13 DIAGNOSIS — Z9221 Personal history of antineoplastic chemotherapy: Secondary | ICD-10-CM

## 2017-04-13 DIAGNOSIS — Z9013 Acquired absence of bilateral breasts and nipples: Secondary | ICD-10-CM

## 2017-04-13 DIAGNOSIS — Z79811 Long term (current) use of aromatase inhibitors: Secondary | ICD-10-CM

## 2017-04-13 DIAGNOSIS — R109 Unspecified abdominal pain: Secondary | ICD-10-CM | POA: Diagnosis not present

## 2017-04-13 DIAGNOSIS — R0602 Shortness of breath: Secondary | ICD-10-CM | POA: Diagnosis not present

## 2017-08-16 DIAGNOSIS — Z9013 Acquired absence of bilateral breasts and nipples: Secondary | ICD-10-CM | POA: Diagnosis not present

## 2017-08-16 DIAGNOSIS — C50919 Malignant neoplasm of unspecified site of unspecified female breast: Secondary | ICD-10-CM | POA: Diagnosis not present

## 2017-08-16 DIAGNOSIS — Z79811 Long term (current) use of aromatase inhibitors: Secondary | ICD-10-CM | POA: Diagnosis not present

## 2017-08-16 DIAGNOSIS — Z9221 Personal history of antineoplastic chemotherapy: Secondary | ICD-10-CM | POA: Diagnosis not present

## 2018-02-13 DIAGNOSIS — C50919 Malignant neoplasm of unspecified site of unspecified female breast: Secondary | ICD-10-CM

## 2018-02-13 DIAGNOSIS — Z9221 Personal history of antineoplastic chemotherapy: Secondary | ICD-10-CM

## 2018-02-13 DIAGNOSIS — Z79811 Long term (current) use of aromatase inhibitors: Secondary | ICD-10-CM

## 2018-02-13 DIAGNOSIS — Z9013 Acquired absence of bilateral breasts and nipples: Secondary | ICD-10-CM

## 2018-08-22 DIAGNOSIS — C50919 Malignant neoplasm of unspecified site of unspecified female breast: Secondary | ICD-10-CM

## 2018-08-22 DIAGNOSIS — Z9221 Personal history of antineoplastic chemotherapy: Secondary | ICD-10-CM

## 2018-08-22 DIAGNOSIS — Z170 Estrogen receptor positive status [ER+]: Secondary | ICD-10-CM | POA: Diagnosis not present

## 2018-08-22 DIAGNOSIS — Z79811 Long term (current) use of aromatase inhibitors: Secondary | ICD-10-CM

## 2018-08-22 DIAGNOSIS — J209 Acute bronchitis, unspecified: Secondary | ICD-10-CM

## 2018-08-22 DIAGNOSIS — Z9013 Acquired absence of bilateral breasts and nipples: Secondary | ICD-10-CM

## 2019-02-20 DIAGNOSIS — C50211 Malignant neoplasm of upper-inner quadrant of right female breast: Secondary | ICD-10-CM | POA: Diagnosis not present

## 2019-04-15 ENCOUNTER — Other Ambulatory Visit: Payer: Self-pay

## 2019-04-15 ENCOUNTER — Ambulatory Visit: Payer: Commercial Managed Care - PPO | Admitting: Plastic Surgery

## 2019-04-15 ENCOUNTER — Encounter: Payer: Self-pay | Admitting: Plastic Surgery

## 2019-04-15 VITALS — BP 124/67 | HR 84 | Temp 96.9°F | Ht 63.0 in | Wt 155.0 lb

## 2019-04-15 DIAGNOSIS — Z1379 Encounter for other screening for genetic and chromosomal anomalies: Secondary | ICD-10-CM

## 2019-04-15 DIAGNOSIS — Z803 Family history of malignant neoplasm of breast: Secondary | ICD-10-CM | POA: Diagnosis not present

## 2019-04-15 NOTE — Progress Notes (Signed)
Patient ID: Betty Ball, female    DOB: [birth year], 43 y.o.   MRN: [id number]   Chief Complaint  Patient presents with  . Breast Problem  . Breast Cancer    The patient is a 43 year old female here for evaluation of her breasts.  She was diagnosed with right breast cancer in May 2017.  She underwent bilateral mastectomies.  She did not have any radiation.  She did have chemotherapy.  She is 5 feet 2 inches tall.  Her preoperative bra size was a 40DDD.  In 2017 she was 200 pounds.  She is now 155 pounds.  In 2017 she had removal of the excess soft tissue and skin.  Now that she has lost the weight she has a little bit of excess skin again.  She is also interested in autologous reconstruction.   She is being treated with letrozole. She has stage III kidney disease and thyroid disease.  Her last hemoglobin A1c was 5.7.  Her last GFR was 39 and creatinine clearance 37.4.  Her BMI is 28.  She has a history of trochanteric bursitis.    Review of Systems  Constitutional: Negative.  Negative for activity change and appetite change.  HENT: Negative.   Eyes: Negative.   Respiratory: Negative for chest tightness and shortness of breath.   Cardiovascular: Negative.   Gastrointestinal: Negative.   Endocrine: Negative.   Genitourinary: Negative.   Musculoskeletal: Negative.   Skin: Negative.   Neurological: Negative.   Hematological: Negative.   Psychiatric/Behavioral: Negative.     Past Medical History:  Diagnosis Date  . Allergy-induced asthma    prn inhaler  . Anemia    takes iron supplement  . Bursitis    hips  . Chronic kidney disease (CKD), stage III (moderate) (HCC)   . GERD (gastroesophageal reflux disease)   . Graves' disease   . History of breast cancer 12/2015   right  . History of chemotherapy    finished 05/2016  . History of kidney stones   . History of migraine headaches   . Seasonal allergies     Past Surgical History:  Procedure Laterality Date  .  ABDOMINAL HYSTERECTOMY    . BILATERAL OOPHORECTOMY  10/26/2015  . BILATERAL SALPINGECTOMY N/A 04/06/2014   Procedure: BILATERAL SALPINGECTOMY;  Surgeon: Janyth Contes, MD;  Location: [facility];  Service: Obstetrics;  Laterality: N/A;  . BILATERAL TOTAL MASTECTOMY WITH AXILLARY LYMPH NODE DISSECTION  12/2015  . CESAREAN SECTION  2001, 2009  . CESAREAN SECTION N/A 04/06/2014   Procedure: CESAREAN SECTION;  Surgeon: Janyth Contes, MD;  Location: [facility];  Service: Obstetrics;  Laterality: N/A;  . CYSTOSCOPY W/ URETERAL STENT PLACEMENT Bilateral   . CYSTOSCOPY/RETROGRADE/URETEROSCOPY  06/11/2015  . EXCISION VAGINAL CYST  06/11/2015  . LAPAROSCOPIC HYSTERECTOMY  10/26/2015  . REMOVAL OF BILATERAL TISSUE EXPANDERS WITH PLACEMENT OF BILATERAL BREAST IMPLANTS Bilateral 07/05/2016   Procedure: EXCISION OF BILATERAL BREAST TISSUE AND LIPOSUCTION FOR SYMMETRY;  Surgeon: Wallace Going, DO;  Location: [facility];  Service: Plastics;  Laterality: Bilateral;  . WISDOM TOOTH EXTRACTION        Current Outpatient Medications:  .  albuterol (PROVENTIL HFA;VENTOLIN HFA) 108 (90 Base) MCG/ACT inhaler, Inhale into the lungs every 6 (six) hours as needed for wheezing or shortness of breath., Disp: , Rfl:  .  calcitRIOL (ROCALTROL) 0.25 MCG capsule, Take by mouth., Disp: , Rfl:  .  ferrous sulfate 325 (65 FE) MG tablet, Take 325 mg  by mouth daily. , Disp: , Rfl:  .  methimazole (TAPAZOLE) 5 MG tablet, Take 2.5 mg by mouth daily. , Disp: , Rfl:  .  montelukast (SINGULAIR) 10 MG tablet, Take 10 mg by mouth., Disp: , Rfl:  .  omeprazole (PRILOSEC) 40 MG capsule, , Disp: , Rfl:  .  ranitidine (ZANTAC) 150 MG tablet, Take 150 mg by mouth daily. , Disp: , Rfl:  .  sertraline (ZOLOFT) 50 MG tablet, Take 50 mg by mouth., Disp: , Rfl:    Objective:   There were no vitals filed for this visit.  Physical Exam Vitals signs and nursing note reviewed.  Constitutional:      Appearance:  Normal appearance.  HENT:     Head: Normocephalic.  Eyes:     Extraocular Movements: Extraocular movements intact.  Cardiovascular:     Rate and Rhythm: Normal rate.     Pulses: Normal pulses.  Pulmonary:     Effort: Pulmonary effort is normal. No respiratory distress.  Abdominal:     General: Abdomen is flat. There is no distension.     Tenderness: There is no abdominal tenderness.  Neurological:     General: No focal deficit present.     Mental Status: She is alert and oriented to person, place, and time.  Psychiatric:        Mood and Affect: Mood normal.        Behavior: Behavior normal.        Thought Content: Thought content normal.     Assessment & Plan:  Genetic testing  Family history of breast cancer in female  Assessment and Plan:  A long, detailed conversation was had regarding the patient's options for breast reconstruction. Five main points, which are explained to all breast reconstruction patients, were discussed.  1. Breast reconstruction is an optional process.  2. Breast reconstruction is a multi-stage process which involves multiple surgeries spaced several months apart. The entire process can take over one year.  3. The major goal of breast reconstruction is to have the patient look normal in clothing. When naked, there will always be scars.  4. Asymmetries are often present during the reconstruction process. Several operations may be needed, including surgery to the non-cancerous breast, to achieve satisfactory results.  5. No matter the reconstructive method, there are ways that the reconstruction can fail and a secondary reconstructive plan would need to be created.   A general discussion regarding all available methods of breast reconstruction were discussed. The types of reconstructions described included.  1. Tissue expander and implant based reconstruction, both single and multi-stage approaches.  2. Autologous only reconstructions, including free  abdominal-tissue based reconstructions.  3. Combination procedures, particularly latissismus dorsi flaps combined with either expanders or implants.  For each of the reconstruction methods mentioned above, the risks, benefits, alternatives, scarring, and recovery time were discussed in great detail. Specific risks detailed included bleeding, infection, hematoma, seroma, scarring, pain, wound healing complications, flap loss, fat necrosis, capsular contracture, need for implant removal, donor site complications, bulge, hernia, umbilical necrosis, need for urgent reoperation, and need for dressing changes were discussed.   Assessment  Once all reconstruction options were presented, a focused discussion was had regarding the patient's suitability for each of these procedures.  A total of 50 minutes of face-to-face time was spent in this encounter, of which >50% was spent in counseling. She is interested in autologous breast reconstruction.  If she is not a candidate then she is interested in a  panniculectomy.  She wants to think about her options and talk to her husband about it.  We discussed the all the above information.  My concern is with her kidney disease and her healing.  I have reached out to a surgeon who does like her surgery and will let the patient know the response.  Pictures were obtained of the patient and placed in the chart with the patient's or guardian's permission.   [place], DO

## 2020-06-07 ENCOUNTER — Other Ambulatory Visit: Payer: Self-pay | Admitting: Hematology and Oncology

## 2020-07-22 ENCOUNTER — Telehealth: Payer: Self-pay | Admitting: Oncology

## 2020-07-22 NOTE — Telephone Encounter (Signed)
12/16 Patient stated that she was having swelling at Right mastectomy site.Scheduled appt on 12/22@9am .

## 2020-07-27 NOTE — Progress Notes (Signed)
[facility]  [address] [place],  [place]  [address] [phone]  Clinic Day:  07/28/2020  Referring physician: Marylen Ponto, MD   HISTORY OF PRESENT ILLNESS:  The patient is a 44 y.o. female with multifocal, weakly hormone positive stage IA (T1c N0 M0) invasive breast cancer, status post a bilateral mastectomy in May 2017.  She finished her 6 cycles of adjuvant TAC chemotherapy in October 2017.  Since then, she has been taking letrozole for the adjuvant hormonal management of her breast cancer.  She comes in today off-schedule as she has felt nodules and noticed swelling over her right chest wall.  She initially felt 2 nodules before she noticed that her entire right chest wall became swollen.  She took ibuprofen, which she believes has ultimately led to a resolution of her chest wall issues.    PHYSICAL EXAM:  Blood pressure 134/81, pulse 82, temperature 98.1 F (36.7 C), resp. rate 16, height 5\' 2"  (1.575 m), weight 197 lb 14.4 oz (89.8 kg), SpO2 96 %, unknown if currently breastfeeding. Wt Readings from Last 3 Encounters:  07/28/20 197 lb 14.4 oz (89.8 kg)  04/15/19 155 lb (70.3 kg)  07/05/16 198 lb (89.8 kg)   Body mass index is 36.2 kg/m. Performance status (ECOG): 0 - Asymptomatic Physical Exam Constitutional:      Appearance: Normal appearance. She is not ill-appearing.  HENT:     Mouth/Throat:     Mouth: Mucous membranes are moist.     Pharynx: Oropharynx is clear. No oropharyngeal exudate or posterior oropharyngeal erythema.  Cardiovascular:     Rate and Rhythm: Normal rate and regular rhythm.     Heart sounds: No murmur heard. No friction rub. No gallop.   Pulmonary:     Effort: Pulmonary effort is normal. No respiratory distress.     Breath sounds: Normal breath sounds. No wheezing, rhonchi or rales.  Chest:  Breasts:     Right: Absent. No swelling, bleeding, inverted nipple, mass, nipple discharge, skin change, axillary  adenopathy or supraclavicular adenopathy.     Left: Absent. No swelling, bleeding, inverted nipple, mass, nipple discharge, skin change, axillary adenopathy or supraclavicular adenopathy.    Abdominal:     General: Bowel sounds are normal. There is no distension.     Palpations: Abdomen is soft. There is no mass.     Tenderness: There is no abdominal tenderness.  Musculoskeletal:        General: No swelling or tenderness.     Cervical back: Normal range of motion and neck supple.     Right lower leg: No edema.     Left lower leg: No edema.  Lymphadenopathy:     Cervical: No cervical adenopathy.     Right cervical: No superficial, deep or posterior cervical adenopathy.    Left cervical: No superficial, deep or posterior cervical adenopathy.     Upper Body:     Right upper body: No supraclavicular or axillary adenopathy.     Left upper body: No supraclavicular or axillary adenopathy.     Lower Body: No right inguinal adenopathy. No left inguinal adenopathy.  Skin:    General: Skin is warm.     Coloration: Skin is not jaundiced.     Findings: No lesion or rash.  Neurological:     General: No focal deficit present.     Mental Status: She is alert and oriented to person, place, and time. Mental status is at baseline.  Cranial Nerves: Cranial nerves are intact.  Psychiatric:        Mood and Affect: Mood normal.        Behavior: Behavior normal.        Thought Content: Thought content normal.        Judgment: Judgment normal.    ASSESSMENT & PLAN:   Assessment/Plan:  A 44 y.o. female with multifocal, weakly hormone receptor positive stage IA (T1c N0 M0) invasive breast cancer, status post a bilateral mastectomy and 6 cycles of adjuvant TAC chemotherapy.  There remains nothing per her physical exam today which concerns me for possible disease recurrence.  Her right chest wall exam was completely normal, with no skin nodules or inflammation seen.  She knows to continue taking her  letrozole on a daily basis for the adjuvant hormonal management of her breast cancer.  Otherwise, as she is doing well, I will see her back in 6 months for repeat clinical assessment.  If her next exam is normal, I will begin spacing all future appointments out to once per year.  The patient understands all the plans discussed today and is in agreement with them.     Talyah Seder Macarthur Critchley, MD

## 2020-07-28 ENCOUNTER — Inpatient Hospital Stay: Payer: Self-pay | Attending: Oncology | Admitting: Oncology

## 2020-07-28 ENCOUNTER — Other Ambulatory Visit: Payer: Self-pay

## 2020-07-28 VITALS — BP 134/81 | HR 82 | Temp 98.1°F | Resp 16 | Ht 62.0 in | Wt 197.9 lb

## 2020-07-28 DIAGNOSIS — C50911 Malignant neoplasm of unspecified site of right female breast: Secondary | ICD-10-CM

## 2020-07-28 DIAGNOSIS — Z170 Estrogen receptor positive status [ER+]: Secondary | ICD-10-CM

## 2020-07-28 NOTE — Progress Notes (Signed)
Pt complains of right chest wall swelling, right axilla swelling, and two palpable knots on right chest wall.  Some associated pain. Had Thyroidectomy around march of 2021.

## 2021-01-24 ENCOUNTER — Telehealth: Payer: Self-pay | Admitting: Oncology

## 2021-01-24 NOTE — Telephone Encounter (Signed)
01/24/21 Spoke with patient and cancelled all appts.[place].

## 2021-01-26 ENCOUNTER — Inpatient Hospital Stay: Payer: Self-pay | Admitting: Oncology
